# Patient Record
Sex: Female | Born: 1940
Health system: Southern US, Community
[De-identification: ages and names within clinical notes are randomized; demographics above are authoritative.]

## PROBLEM LIST (undated history)

## (undated) DIAGNOSIS — I340 Nonrheumatic mitral (valve) insufficiency: Secondary | ICD-10-CM

## (undated) DIAGNOSIS — I4892 Unspecified atrial flutter: Secondary | ICD-10-CM

## (undated) DIAGNOSIS — I499 Cardiac arrhythmia, unspecified: Secondary | ICD-10-CM

## (undated) DIAGNOSIS — I509 Heart failure, unspecified: Secondary | ICD-10-CM

## (undated) DIAGNOSIS — R0609 Other forms of dyspnea: Secondary | ICD-10-CM

## (undated) DIAGNOSIS — I5030 Unspecified diastolic (congestive) heart failure: Secondary | ICD-10-CM

## (undated) DIAGNOSIS — R0600 Dyspnea, unspecified: Secondary | ICD-10-CM

## (undated) DIAGNOSIS — M1990 Unspecified osteoarthritis, unspecified site: Secondary | ICD-10-CM

## (undated) HISTORY — DX: Other forms of dyspnea: R06.09

## (undated) HISTORY — PX: KNEE ARTHROSCOPY: SUR90

## (undated) HISTORY — DX: Dyspnea, unspecified: R06.00

## (undated) HISTORY — DX: Heart failure, unspecified: I50.9

## (undated) HISTORY — DX: Unspecified atrial flutter: I48.92

## (undated) HISTORY — DX: Cardiac arrhythmia, unspecified: I49.9

## (undated) HISTORY — DX: Nonrheumatic mitral (valve) insufficiency: I34.0

## (undated) HISTORY — DX: Unspecified diastolic (congestive) heart failure: I50.30

---

## 1964-04-22 HISTORY — PX: TUBAL LIGATION: SHX77

## 1964-04-22 HISTORY — PX: CARPAL TUNNEL RELEASE: SHX101

## 2000-08-01 ENCOUNTER — Inpatient Hospital Stay (HOSPITAL_COMMUNITY): Admission: RE | Admit: 2000-08-01 | Discharge: 2000-08-04 | Payer: Self-pay | Admitting: Orthopaedic Surgery

## 2010-04-22 HISTORY — PX: JOINT REPLACEMENT: SHX530

## 2014-07-13 DIAGNOSIS — M25552 Pain in left hip: Secondary | ICD-10-CM | POA: Diagnosis not present

## 2014-07-13 DIAGNOSIS — M542 Cervicalgia: Secondary | ICD-10-CM | POA: Diagnosis not present

## 2014-07-14 DIAGNOSIS — M1610 Unilateral primary osteoarthritis, unspecified hip: Secondary | ICD-10-CM | POA: Diagnosis not present

## 2014-07-14 DIAGNOSIS — M5136 Other intervertebral disc degeneration, lumbar region: Secondary | ICD-10-CM | POA: Diagnosis not present

## 2014-07-14 DIAGNOSIS — M25552 Pain in left hip: Secondary | ICD-10-CM | POA: Diagnosis not present

## 2014-07-14 DIAGNOSIS — Z96641 Presence of right artificial hip joint: Secondary | ICD-10-CM | POA: Diagnosis not present

## 2014-07-14 DIAGNOSIS — M4186 Other forms of scoliosis, lumbar region: Secondary | ICD-10-CM | POA: Diagnosis not present

## 2014-07-14 DIAGNOSIS — M549 Dorsalgia, unspecified: Secondary | ICD-10-CM | POA: Diagnosis not present

## 2014-07-14 DIAGNOSIS — M1612 Unilateral primary osteoarthritis, left hip: Secondary | ICD-10-CM | POA: Diagnosis not present

## 2014-07-25 DIAGNOSIS — M25552 Pain in left hip: Secondary | ICD-10-CM | POA: Diagnosis not present

## 2014-08-03 DIAGNOSIS — I714 Abdominal aortic aneurysm, without rupture: Secondary | ICD-10-CM | POA: Diagnosis not present

## 2014-08-03 DIAGNOSIS — N200 Calculus of kidney: Secondary | ICD-10-CM | POA: Diagnosis not present

## 2014-08-10 DIAGNOSIS — H5200 Hypermetropia, unspecified eye: Secondary | ICD-10-CM | POA: Diagnosis not present

## 2014-08-10 DIAGNOSIS — H5210 Myopia, unspecified eye: Secondary | ICD-10-CM | POA: Diagnosis not present

## 2014-08-16 DIAGNOSIS — M1612 Unilateral primary osteoarthritis, left hip: Secondary | ICD-10-CM | POA: Diagnosis not present

## 2014-09-01 ENCOUNTER — Other Ambulatory Visit (HOSPITAL_COMMUNITY): Payer: Self-pay | Admitting: Orthopaedic Surgery

## 2014-09-09 NOTE — Pre-Procedure Instructions (Addendum)
Marcia Leblanc  09/09/2014      [facility] - [address], [place] [address] Phone: [phone] Fax: [phone]    Your procedure is scheduled on Friday, September 23, 2014  Report to [facility] Admitting at 5:30 A.M.  Call this number if you have problems the morning of surgery:  [phone]   Remember:  Do not eat food or drink liquids after midnight Thursday, September 22, 2014  Take these medicines the morning of surgery with A SIP OF WATER : if needed: acetaminophen (TYLENOL) for pain  Stop taking Aspirin, vitamins and herbal medications. Do not take any NSAIDs ie: Ibuprofen, Advil, Naproxen or any medication containing Aspirin; stop 5 days prior to procedure ( Friday, Sep 18, 2014)   Do not wear jewelry, make-up or nail polish.  Do not wear lotions, powders, or perfumes.  You may not wear deodorant.  Do not shave 48 hours prior to surgery.   Do not bring valuables to the hospital.  [facility] is not responsible for any belongings or valuables.  Contacts, dentures or bridgework may not be worn into surgery.  Leave your suitcase in the car.  After surgery it may be brought to your room.  For patients admitted to the hospital, discharge time will be determined by your treatment team.  Patients discharged the day of surgery will not be allowed to drive home.   Name and phone number of your driver:   Special instructions:   Special Instructions:Special Instructions: [facility] - Preparing for Surgery  Before surgery, you can play an important role.  Because skin is not sterile, your skin needs to be as free of germs as possible.  You can reduce the number of germs on you skin by washing with CHG (chlorahexidine gluconate) soap before surgery.  CHG is an antiseptic cleaner which kills germs and bonds with the skin to continue killing germs even after washing.  Please DO NOT use if you have an allergy to CHG or  antibacterial soaps.  If your skin becomes reddened/irritated stop using the CHG and inform your nurse when you arrive at Short Stay.  Do not shave (including legs and underarms) for at least 48 hours prior to the first CHG shower.  You may shave your face.  Please follow these instructions carefully:   1.  Shower with CHG Soap the night before surgery and the morning of Surgery.  2.  If you choose to wash your hair, wash your hair first as usual with your normal shampoo.  3.  After you shampoo, rinse your hair and body thoroughly to remove the Shampoo.  4.  Use CHG as you would any other liquid soap.  You can apply chg directly  to the skin and wash gently with scrungie or a clean washcloth.  5.  Apply the CHG Soap to your body ONLY FROM THE NECK DOWN.  Do not use on open wounds or open sores.  Avoid contact with your eyes, ears, mouth and genitals (private parts).  Wash genitals (private parts) with your normal soap.  6.  Wash thoroughly, paying special attention to the area where your surgery will be performed.  7.  Thoroughly rinse your body with warm water from the neck down.  8.  DO NOT shower/wash with your normal soap after using and rinsing off the CHG Soap.  9.  Pat yourself dry with a clean towel.  10.  Wear clean pajamas.            11.  Place clean sheets on your bed the night of your first shower and do not sleep with pets.  Day of Surgery  Do not apply any lotions/deodorants the morning of surgery.  Please wear clean clothes to the hospital/surgery center.  Please read over the following fact sheets that you were given. Pain Booklet, Coughing and Deep Breathing, Total Joint Packet, MRSA Information and Surgical Site Infection Prevention

## 2014-09-12 ENCOUNTER — Ambulatory Visit (HOSPITAL_COMMUNITY)
Admission: RE | Admit: 2014-09-12 | Discharge: 2014-09-12 | Disposition: A | Discharge status: Home / Self Care | Payer: Commercial Managed Care - HMO | Source: Ambulatory Visit | Attending: Orthopaedic Surgery | Admitting: Orthopaedic Surgery

## 2014-09-12 ENCOUNTER — Encounter (HOSPITAL_COMMUNITY): Payer: Self-pay

## 2014-09-12 ENCOUNTER — Other Ambulatory Visit (HOSPITAL_COMMUNITY): Payer: Self-pay

## 2014-09-12 ENCOUNTER — Encounter (HOSPITAL_COMMUNITY)
Admission: RE | Admit: 2014-09-12 | Discharge: 2014-09-12 | Disposition: A | Discharge status: Home / Self Care | Payer: Commercial Managed Care - HMO | Source: Ambulatory Visit | Attending: Orthopaedic Surgery | Admitting: Orthopaedic Surgery

## 2014-09-12 DIAGNOSIS — Z01810 Encounter for preprocedural cardiovascular examination: Secondary | ICD-10-CM | POA: Insufficient documentation

## 2014-09-12 DIAGNOSIS — Z01818 Encounter for other preprocedural examination: Secondary | ICD-10-CM | POA: Diagnosis not present

## 2014-09-12 DIAGNOSIS — R509 Fever, unspecified: Secondary | ICD-10-CM | POA: Diagnosis not present

## 2014-09-12 DIAGNOSIS — M169 Osteoarthritis of hip, unspecified: Secondary | ICD-10-CM | POA: Insufficient documentation

## 2014-09-12 DIAGNOSIS — Z01812 Encounter for preprocedural laboratory examination: Secondary | ICD-10-CM | POA: Diagnosis not present

## 2014-09-12 HISTORY — DX: Unspecified osteoarthritis, unspecified site: M19.90

## 2014-09-12 LAB — CBC
HEMATOCRIT: 41.7 % (ref 36.0–46.0)
Hemoglobin: 14.2 g/dL (ref 12.0–15.0)
MCH: 29.5 pg (ref 26.0–34.0)
MCHC: 34.1 g/dL (ref 30.0–36.0)
MCV: 86.7 fL (ref 78.0–100.0)
Platelets: 161 10*3/uL (ref 150–400)
RBC: 4.81 MIL/uL (ref 3.87–5.11)
RDW: 14.3 % (ref 11.5–15.5)
WBC: 7.3 10*3/uL (ref 4.0–10.5)

## 2014-09-12 LAB — COMPREHENSIVE METABOLIC PANEL
ALBUMIN: 3.9 g/dL (ref 3.5–5.0)
ALT: 24 U/L (ref 14–54)
ANION GAP: 10 (ref 5–15)
AST: 23 U/L (ref 15–41)
Alkaline Phosphatase: 54 U/L (ref 38–126)
BUN: 6 mg/dL (ref 6–20)
CALCIUM: 9.6 mg/dL (ref 8.9–10.3)
CHLORIDE: 106 mmol/L (ref 101–111)
CO2: 22 mmol/L (ref 22–32)
CREATININE: 0.76 mg/dL (ref 0.44–1.00)
GFR calc Af Amer: >60 mL/min (ref >60)
GFR calc non Af Amer: >60 mL/min (ref >60)
Glucose, Bld: 99 mg/dL (ref 65–99)
POTASSIUM: 4 mmol/L (ref 3.5–5.1)
Sodium: 138 mmol/L (ref 135–145)
Total Bilirubin: 0.6 mg/dL (ref 0.3–1.2)
Total Protein: 6.6 g/dL (ref 6.5–8.1)

## 2014-09-12 LAB — URINALYSIS, ROUTINE W REFLEX MICROSCOPIC
Bilirubin Urine: NEGATIVE
GLUCOSE, UA: NEGATIVE mg/dL
HGB URINE DIPSTICK: NEGATIVE
Ketones, ur: NEGATIVE mg/dL
Leukocytes, UA: NEGATIVE
Nitrite: NEGATIVE
PROTEIN: NEGATIVE mg/dL
Specific Gravity, Urine: 1.006 (ref 1.005–1.030)
Urobilinogen, UA: 0.2 mg/dL (ref 0.0–1.0)
pH: 7 (ref 5.0–8.0)

## 2014-09-12 LAB — SURGICAL PCR SCREEN
MRSA, PCR: NEGATIVE
Staphylococcus aureus: NEGATIVE

## 2014-09-12 LAB — PROTIME-INR
INR: 1.12 (ref 0.00–1.49)
PROTHROMBIN TIME: 14.6 s (ref 11.6–15.2)

## 2014-09-12 NOTE — Progress Notes (Signed)
reqd ekg, office notes, any cardiac tests from [place] [facility]  Dr Reesa Chew saad

## 2014-09-13 NOTE — Progress Notes (Signed)
Anesthesia Chart Review:  Pt is 74 year old female scheduled for L total hip arthroplasty on 09/23/2014 with Dr. Lorin Mercy.  PMH includes: arthritis. BP at PAT was 153/92, HTN not listed in history (BP was 144/80 at PCP's office on 07/25/14). Former smoker. BMI 25  Preoperative labs reviewed.    Chest x-ray 09/12/2014 reviewed. Mild bibasilar atelectatic change. No frank edema or consolidation.   EKG 09/12/2014: Sinus rhythm with occasional PVCs, new since prior tracing. Possible Left atrial enlargement. RSR' or QR pattern in V1 suggests right ventricular conduction delay. ST depression, consider subendocardial injury.   Discussed EKG with Dr. Glennon Mac who felt it was not significantly unchanged from prior 2002 EKG tracing.   If no changes, I anticipate pt can proceed with surgery as scheduled.   Willeen Cass, FNP-BC [facility]/Anesthesiology Phone: [phone] 09/13/2014 3:47 PM

## 2014-09-22 MED ORDER — CEFAZOLIN SODIUM-DEXTROSE 2-3 GM-% IV SOLR
2.0000 g | INTRAVENOUS | Status: AC
  Administered 2014-09-23: 2 g via INTRAVENOUS
  Filled 2014-09-22: qty 50

## 2014-09-22 NOTE — H&P (Signed)
TOTAL HIP ADMISSION H&P  Patient is admitted for left total hip arthroplasty.  Subjective:  Chief Complaint: left hip pain  HPI: Marcia Leblanc, 74 y.o. female, has a history of pain and functional disability in the left hip(s) due to arthritis and patient has failed non-surgical conservative treatments for greater than 12 weeks to include NSAID's and/or analgesics, use of assistive devices and activity modification.  Onset of symptoms was gradual starting >10 years ago with gradually worsening course since that time. .  Patient currently rates pain in the left hip at 10 out of 10 with activity. Patient has night pain, worsening of pain with activity and weight bearing, trendelenberg gait and joint swelling. Patient has evidence of subchondral cysts, periarticular osteophytes and joint space narrowing by imaging studies. This condition presents safety issues increasing the risk of falls.   There is no current active infection.  There are no active problems to display for this patient.  Past Medical History  Diagnosis Date  . Arthritis     Past Surgical History  Procedure Laterality Date  . Tubal ligation  68  . Joint replacement Right 2012    hip  . Carpal tunnel release Bilateral 68  . Knee arthroscopy Right     torn cartilage    No prescriptions prior to admission   No Known Allergies  History  Substance Use Topics  . Smoking status: Former Smoker -- 0.50 packs/day for 50 years    Types: Cigarettes    Quit date: 09/11/2012  . Smokeless tobacco: Not on file  . Alcohol Use: 4.2 oz/week    7 Glasses of wine per week     Comment: 4 oz daily    No family history on file.   Review of Systems  Unable to perform ROS   Objective:  Physical Exam PHYSICAL EXAMINATION:  The patient is alert and oriented.  Cooperative and comfortable in the sitting position.  Height 5 feet 1 inch.  Weight 137 pounds.  She ambulates with a significant Trendelenburg gait on the left.  She has a cane  with her today.  She has difficulty walking without the cane.  She has good visual acuity with glasses.  No rash over exposed skin.  Right total hip posterior incision is well healed.  Lungs are clear.  Heart:  Regular rate and rhythm.  No wheezing.  Abdomen is soft and nontender.  Distal pulses are 2+.  She has 0 degrees of internal rotation of the left hip, which is painful; 20 degrees of external rotation, which is painful.  No hip flexion contracture.  Knee reaches full extension.  Anterior tib and EHL intact.  She was diagnosed with sciatica several years ago.  No sciatic notch tenderness.  Negative stretch test.  Negative popliteal compression test today.   Vital signs in last 24 hours:    Labs:   There is no height or weight on file to calculate BMI.   Imaging Review Plain radiographs demonstrate moderate degenerative joint disease of the left hip(s). The bone quality appears to be good for age and reported activity level.  Assessment/Plan:  End stage arthritis, left hip(s)  The patient history, physical examination, clinical judgement of the provider and imaging studies are consistent with end stage degenerative joint disease of the left hip(s) and total hip arthroplasty is deemed medically necessary. The treatment options including medical management, injection therapy, arthroscopy and arthroplasty were discussed at length. The risks and benefits of total hip arthroplasty were presented and  reviewed. The risks due to aseptic loosening, infection, stiffness, dislocation/subluxation,  thromboembolic complications and other imponderables were discussed.  The patient acknowledged the explanation, agreed to proceed with the plan and consent was signed. Patient is being admitted for inpatient treatment for surgery, pain control, PT, OT, prophylactic antibiotics, VTE prophylaxis, progressive ambulation and ADL's and discharge planning.The patient is planning to be discharged home with home health  services

## 2014-09-23 ENCOUNTER — Inpatient Hospital Stay (HOSPITAL_COMMUNITY): Payer: Commercial Managed Care - HMO

## 2014-09-23 ENCOUNTER — Inpatient Hospital Stay (HOSPITAL_COMMUNITY)
Admission: RE | Admit: 2014-09-23 | Discharge: 2014-09-25 | DRG: 470 | Disposition: A | Discharge status: Home Health | Payer: Commercial Managed Care - HMO | Source: Ambulatory Visit | Attending: Orthopaedic Surgery | Admitting: Orthopaedic Surgery

## 2014-09-23 ENCOUNTER — Inpatient Hospital Stay (HOSPITAL_COMMUNITY): Payer: Commercial Managed Care - HMO | Admitting: Emergency Medicine

## 2014-09-23 ENCOUNTER — Inpatient Hospital Stay (HOSPITAL_COMMUNITY): Payer: Commercial Managed Care - HMO | Admitting: Anesthesiology

## 2014-09-23 ENCOUNTER — Encounter (HOSPITAL_COMMUNITY)
Admission: RE | Disposition: A | Discharge status: Home Health | Payer: Self-pay | Source: Ambulatory Visit | Attending: Orthopaedic Surgery

## 2014-09-23 ENCOUNTER — Encounter (HOSPITAL_COMMUNITY): Payer: Self-pay | Admitting: *Deleted

## 2014-09-23 DIAGNOSIS — M1612 Unilateral primary osteoarthritis, left hip: Secondary | ICD-10-CM | POA: Diagnosis not present

## 2014-09-23 DIAGNOSIS — Z96642 Presence of left artificial hip joint: Secondary | ICD-10-CM

## 2014-09-23 DIAGNOSIS — Z87891 Personal history of nicotine dependence: Secondary | ICD-10-CM | POA: Diagnosis not present

## 2014-09-23 DIAGNOSIS — Z96641 Presence of right artificial hip joint: Secondary | ICD-10-CM | POA: Diagnosis present

## 2014-09-23 DIAGNOSIS — M25552 Pain in left hip: Secondary | ICD-10-CM | POA: Diagnosis present

## 2014-09-23 DIAGNOSIS — Z419 Encounter for procedure for purposes other than remedying health state, unspecified: Secondary | ICD-10-CM

## 2014-09-23 DIAGNOSIS — M169 Osteoarthritis of hip, unspecified: Secondary | ICD-10-CM | POA: Diagnosis not present

## 2014-09-23 DIAGNOSIS — Z79899 Other long term (current) drug therapy: Secondary | ICD-10-CM

## 2014-09-23 DIAGNOSIS — Z7982 Long term (current) use of aspirin: Secondary | ICD-10-CM

## 2014-09-23 HISTORY — DX: Presence of left artificial hip joint: Z96.642

## 2014-09-23 HISTORY — PX: TOTAL HIP ARTHROPLASTY: SHX124

## 2014-09-23 SURGERY — ARTHROPLASTY, HIP, TOTAL, ANTERIOR APPROACH
Anesthesia: General | Site: Hip | Laterality: Left

## 2014-09-23 MED ORDER — METHOCARBAMOL 1000 MG/10ML IJ SOLN
500.0000 mg | Freq: Four times a day (QID) | INTRAVENOUS | Status: DC | PRN
  Filled 2014-09-23: qty 5

## 2014-09-23 MED ORDER — HYDROMORPHONE HCL 1 MG/ML IJ SOLN
0.5000 mg | INTRAMUSCULAR | Status: DC | PRN
  Administered 2014-09-23 – 2014-09-24 (×3): 0.5 mg via INTRAVENOUS
  Filled 2014-09-23 (×3): qty 1

## 2014-09-23 MED ORDER — 0.9 % SODIUM CHLORIDE (POUR BTL) OPTIME
TOPICAL | Status: DC | PRN
  Administered 2014-09-23: 1000 mL

## 2014-09-23 MED ORDER — PROPOFOL 10 MG/ML IV BOLUS
INTRAVENOUS | Status: DC | PRN
  Administered 2014-09-23: 140 mg via INTRAVENOUS

## 2014-09-23 MED ORDER — DOCUSATE SODIUM 100 MG PO CAPS
100.0000 mg | ORAL_CAPSULE | Freq: Two times a day (BID) | ORAL | Status: DC
  Administered 2014-09-24 – 2014-09-25 (×3): 100 mg via ORAL
  Filled 2014-09-23 (×4): qty 1

## 2014-09-23 MED ORDER — METOCLOPRAMIDE HCL 5 MG PO TABS: 5.0000 mg | ORAL_TABLET | Freq: Three times a day (TID) | ORAL | Status: DC | PRN

## 2014-09-23 MED ORDER — CHLORHEXIDINE GLUCONATE 4 % EX LIQD: 60.0000 mL | Freq: Once | CUTANEOUS | Status: DC

## 2014-09-23 MED ORDER — CEFAZOLIN SODIUM-DEXTROSE 2-3 GM-% IV SOLR
2.0000 g | Freq: Once | INTRAVENOUS | Status: AC
  Administered 2014-09-23: 2 g via INTRAVENOUS
  Filled 2014-09-23: qty 50

## 2014-09-23 MED ORDER — MENTHOL 3 MG MT LOZG: 1.0000 | LOZENGE | OROMUCOSAL | Status: DC | PRN

## 2014-09-23 MED ORDER — DEXTROSE 5 % IV SOLN
500.0000 mg | INTRAVENOUS | Status: AC
  Administered 2014-09-23: 500 mg via INTRAVENOUS
  Filled 2014-09-23 (×2): qty 5

## 2014-09-23 MED ORDER — NEOSTIGMINE METHYLSULFATE 10 MG/10ML IV SOLN
INTRAVENOUS | Status: DC | PRN
  Administered 2014-09-23: 2.5 mg via INTRAVENOUS

## 2014-09-23 MED ORDER — PROMETHAZINE HCL 25 MG/ML IJ SOLN: 6.2500 mg | INTRAMUSCULAR | Status: DC | PRN

## 2014-09-23 MED ORDER — FENTANYL CITRATE (PF) 100 MCG/2ML IJ SOLN
INTRAMUSCULAR | Status: DC | PRN
  Administered 2014-09-23: 100 ug via INTRAVENOUS
  Administered 2014-09-23 (×6): 50 ug via INTRAVENOUS
  Administered 2014-09-23: 100 ug via INTRAVENOUS

## 2014-09-23 MED ORDER — DEXAMETHASONE SODIUM PHOSPHATE 10 MG/ML IJ SOLN
INTRAMUSCULAR | Status: DC | PRN
  Administered 2014-09-23: 10 mg via INTRAVENOUS

## 2014-09-23 MED ORDER — METHOCARBAMOL 500 MG PO TABS
500.0000 mg | ORAL_TABLET | Freq: Four times a day (QID) | ORAL | Status: DC | PRN
  Administered 2014-09-23 – 2014-09-25 (×4): 500 mg via ORAL
  Filled 2014-09-23 (×5): qty 1

## 2014-09-23 MED ORDER — ROCURONIUM BROMIDE 50 MG/5ML IV SOLN
INTRAVENOUS | Status: AC
  Filled 2014-09-23: qty 1

## 2014-09-23 MED ORDER — LIDOCAINE HCL (CARDIAC) 20 MG/ML IV SOLN
INTRAVENOUS | Status: DC | PRN
  Administered 2014-09-23: 100 mg via INTRAVENOUS

## 2014-09-23 MED ORDER — GLYCOPYRROLATE 0.2 MG/ML IJ SOLN
INTRAMUSCULAR | Status: DC | PRN
Start: 2014-09-23 — End: 2014-09-23
  Administered 2014-09-23: .5 mg via INTRAVENOUS

## 2014-09-23 MED ORDER — ONDANSETRON HCL 4 MG/2ML IJ SOLN
4.0000 mg | Freq: Four times a day (QID) | INTRAMUSCULAR | Status: DC | PRN
  Administered 2014-09-23: 4 mg via INTRAVENOUS
  Filled 2014-09-23: qty 2

## 2014-09-23 MED ORDER — ONDANSETRON HCL 4 MG/2ML IJ SOLN
INTRAMUSCULAR | Status: DC | PRN
  Administered 2014-09-23: 4 mg via INTRAVENOUS

## 2014-09-23 MED ORDER — ACETAMINOPHEN 650 MG RE SUPP: 650.0000 mg | Freq: Four times a day (QID) | RECTAL | Status: DC | PRN

## 2014-09-23 MED ORDER — HYDROMORPHONE HCL 1 MG/ML IJ SOLN
INTRAMUSCULAR | Status: AC
  Administered 2014-09-23: 0.5 mg via INTRAVENOUS
  Filled 2014-09-23: qty 1

## 2014-09-23 MED ORDER — LIDOCAINE HCL (CARDIAC) 20 MG/ML IV SOLN
INTRAVENOUS | Status: AC
  Filled 2014-09-23: qty 5

## 2014-09-23 MED ORDER — DEXAMETHASONE SODIUM PHOSPHATE 10 MG/ML IJ SOLN
INTRAMUSCULAR | Status: AC
  Filled 2014-09-23: qty 1

## 2014-09-23 MED ORDER — EPHEDRINE SULFATE 50 MG/ML IJ SOLN
INTRAMUSCULAR | Status: DC | PRN
  Administered 2014-09-23: 10 mg via INTRAVENOUS

## 2014-09-23 MED ORDER — HYDROMORPHONE HCL 1 MG/ML IJ SOLN
0.2500 mg | INTRAMUSCULAR | Status: DC | PRN
  Administered 2014-09-23 (×2): 0.5 mg via INTRAVENOUS

## 2014-09-23 MED ORDER — POTASSIUM CHLORIDE IN NACL 20-0.45 MEQ/L-% IV SOLN
INTRAVENOUS | Status: DC
  Administered 2014-09-23: 14:00:00 via INTRAVENOUS
  Filled 2014-09-23 (×6): qty 1000

## 2014-09-23 MED ORDER — OXYCODONE HCL 5 MG PO TABS
5.0000 mg | ORAL_TABLET | ORAL | Status: DC | PRN
  Administered 2014-09-23 – 2014-09-25 (×9): 10 mg via ORAL
  Filled 2014-09-23 (×4): qty 2
  Filled 2014-09-23: qty 1
  Filled 2014-09-23 (×6): qty 2

## 2014-09-23 MED ORDER — FENTANYL CITRATE (PF) 250 MCG/5ML IJ SOLN
INTRAMUSCULAR | Status: AC
  Filled 2014-09-23: qty 5

## 2014-09-23 MED ORDER — ASPIRIN EC 325 MG PO TBEC
325.0000 mg | DELAYED_RELEASE_TABLET | Freq: Every day | ORAL | Status: DC
  Administered 2014-09-24 – 2014-09-25 (×2): 325 mg via ORAL
  Filled 2014-09-23 (×2): qty 1

## 2014-09-23 MED ORDER — PHENYLEPHRINE HCL 10 MG/ML IJ SOLN
INTRAMUSCULAR | Status: DC | PRN
  Administered 2014-09-23 (×5): 40 ug via INTRAVENOUS

## 2014-09-23 MED ORDER — METOCLOPRAMIDE HCL 5 MG/ML IJ SOLN
5.0000 mg | Freq: Three times a day (TID) | INTRAMUSCULAR | Status: DC | PRN
  Administered 2014-09-23: 10 mg via INTRAVENOUS
  Filled 2014-09-23: qty 2

## 2014-09-23 MED ORDER — BISACODYL 10 MG RE SUPP: 10.0000 mg | Freq: Every day | RECTAL | Status: DC | PRN

## 2014-09-23 MED ORDER — PROPOFOL 10 MG/ML IV BOLUS
INTRAVENOUS | Status: AC
  Filled 2014-09-23: qty 20

## 2014-09-23 MED ORDER — MIDAZOLAM HCL 2 MG/2ML IJ SOLN
INTRAMUSCULAR | Status: AC
  Filled 2014-09-23: qty 2

## 2014-09-23 MED ORDER — POLYETHYLENE GLYCOL 3350 17 G PO PACK: 17.0000 g | PACK | Freq: Every day | ORAL | Status: DC | PRN

## 2014-09-23 MED ORDER — NEOSTIGMINE METHYLSULFATE 10 MG/10ML IV SOLN
INTRAVENOUS | Status: AC
  Filled 2014-09-23: qty 1

## 2014-09-23 MED ORDER — ACETAMINOPHEN 325 MG PO TABS: 650.0000 mg | ORAL_TABLET | Freq: Four times a day (QID) | ORAL | Status: DC | PRN

## 2014-09-23 MED ORDER — OCUVITE-LUTEIN PO CAPS
1.0000 | ORAL_CAPSULE | Freq: Every day | ORAL | Status: DC
  Administered 2014-09-24 – 2014-09-25 (×2): 1 via ORAL
  Filled 2014-09-23 (×5): qty 1

## 2014-09-23 MED ORDER — PHENYLEPHRINE 40 MCG/ML (10ML) SYRINGE FOR IV PUSH (FOR BLOOD PRESSURE SUPPORT)
PREFILLED_SYRINGE | INTRAVENOUS | Status: AC
  Filled 2014-09-23: qty 10

## 2014-09-23 MED ORDER — PHENOL 1.4 % MT LIQD
1.0000 | OROMUCOSAL | Status: DC | PRN
Start: 2014-09-23 — End: 2014-09-25

## 2014-09-23 MED ORDER — MEPERIDINE HCL 25 MG/ML IJ SOLN: 6.2500 mg | INTRAMUSCULAR | Status: DC | PRN

## 2014-09-23 MED ORDER — GLYCOPYRROLATE 0.2 MG/ML IJ SOLN
INTRAMUSCULAR | Status: AC
  Filled 2014-09-23: qty 4

## 2014-09-23 MED ORDER — ONDANSETRON HCL 4 MG/2ML IJ SOLN
INTRAMUSCULAR | Status: AC
  Filled 2014-09-23: qty 2

## 2014-09-23 MED ORDER — LACTATED RINGERS IV SOLN
INTRAVENOUS | Status: DC | PRN
  Administered 2014-09-23 (×3): via INTRAVENOUS

## 2014-09-23 MED ORDER — ROCURONIUM BROMIDE 100 MG/10ML IV SOLN
INTRAVENOUS | Status: DC | PRN
  Administered 2014-09-23: 10 mg via INTRAVENOUS
  Administered 2014-09-23: 40 mg via INTRAVENOUS

## 2014-09-23 MED ORDER — ONDANSETRON HCL 4 MG PO TABS: 4.0000 mg | ORAL_TABLET | Freq: Four times a day (QID) | ORAL | Status: DC | PRN

## 2014-09-23 MED ORDER — MIDAZOLAM HCL 5 MG/5ML IJ SOLN
INTRAMUSCULAR | Status: DC | PRN
  Administered 2014-09-23: 1 mg via INTRAVENOUS

## 2014-09-23 SURGICAL SUPPLY — 48 items
BLADE SAW SGTL 18X1.27X75 (BLADE) ×2 IMPLANT
BLADE SAW SGTL 18X1.27X75MM (BLADE) ×1
BLADE SURG ROTATE 9660 (MISCELLANEOUS) IMPLANT
CAPT HIP TOTAL 2 ×3 IMPLANT
CELLS DAT CNTRL 66122 CELL SVR (MISCELLANEOUS) ×1 IMPLANT
COVER SURGICAL LIGHT HANDLE (MISCELLANEOUS) ×3 IMPLANT
DERMABOND ADVANCED (GAUZE/BANDAGES/DRESSINGS) ×2
DERMABOND ADVANCED .7 DNX12 (GAUZE/BANDAGES/DRESSINGS) ×1 IMPLANT
DRAPE C-ARM 42X72 X-RAY (DRAPES) ×3 IMPLANT
DRAPE IMP U-DRAPE 54X76 (DRAPES) ×3 IMPLANT
DRAPE STERI IOBAN 125X83 (DRAPES) ×3 IMPLANT
DRAPE U-SHAPE 47X51 STRL (DRAPES) ×9 IMPLANT
DRSG MEPILEX BORDER 4X8 (GAUZE/BANDAGES/DRESSINGS) ×3 IMPLANT
DURAPREP 26ML APPLICATOR (WOUND CARE) ×3 IMPLANT
ELECT BLADE 4.0 EZ CLEAN MEGAD (MISCELLANEOUS)
ELECT BLADE TIP CTD 4 INCH (ELECTRODE) ×3 IMPLANT
ELECT CAUTERY BLADE 6.4 (BLADE) ×3 IMPLANT
ELECT REM PT RETURN 9FT ADLT (ELECTROSURGICAL) ×3
ELECTRODE BLDE 4.0 EZ CLN MEGD (MISCELLANEOUS) IMPLANT
ELECTRODE REM PT RTRN 9FT ADLT (ELECTROSURGICAL) ×1 IMPLANT
FACESHIELD WRAPAROUND (MASK) ×6 IMPLANT
GLOVE BIOGEL PI IND STRL 8 (GLOVE) ×2 IMPLANT
GLOVE BIOGEL PI INDICATOR 8 (GLOVE) ×4
GLOVE ORTHO TXT STRL SZ7.5 (GLOVE) ×6 IMPLANT
GOWN STRL REUS W/ TWL LRG LVL3 (GOWN DISPOSABLE) ×1 IMPLANT
GOWN STRL REUS W/ TWL XL LVL3 (GOWN DISPOSABLE) ×1 IMPLANT
GOWN STRL REUS W/TWL 2XL LVL3 (GOWN DISPOSABLE) ×3 IMPLANT
GOWN STRL REUS W/TWL LRG LVL3 (GOWN DISPOSABLE) ×2
GOWN STRL REUS W/TWL XL LVL3 (GOWN DISPOSABLE) ×2
KIT BASIN OR (CUSTOM PROCEDURE TRAY) ×3 IMPLANT
KIT ROOM TURNOVER OR (KITS) ×3 IMPLANT
MANIFOLD NEPTUNE II (INSTRUMENTS) ×3 IMPLANT
NS IRRIG 1000ML POUR BTL (IV SOLUTION) ×3 IMPLANT
PACK TOTAL JOINT (CUSTOM PROCEDURE TRAY) ×3 IMPLANT
PACK UNIVERSAL I (CUSTOM PROCEDURE TRAY) ×3 IMPLANT
PAD ARMBOARD 7.5X6 YLW CONV (MISCELLANEOUS) ×6 IMPLANT
RTRCTR WOUND ALEXIS 18CM MED (MISCELLANEOUS) ×3
SUT ETHIBOND NAB CT1 #1 30IN (SUTURE) IMPLANT
SUT VIC AB 0 CT1 27 (SUTURE) ×2
SUT VIC AB 0 CT1 27XBRD ANBCTR (SUTURE) ×1 IMPLANT
SUT VIC AB 2-0 CT1 27 (SUTURE) ×2
SUT VIC AB 2-0 CT1 TAPERPNT 27 (SUTURE) ×1 IMPLANT
SUT VICRYL 4-0 PS2 18IN ABS (SUTURE) ×3 IMPLANT
SUT VLOC 180 0 24IN GS25 (SUTURE) ×3 IMPLANT
TOWEL OR 17X24 6PK STRL BLUE (TOWEL DISPOSABLE) ×3 IMPLANT
TOWEL OR 17X26 10 PK STRL BLUE (TOWEL DISPOSABLE) ×6 IMPLANT
TRAY FOLEY CATH 16FRSI W/METER (SET/KITS/TRAYS/PACK) IMPLANT
WATER STERILE IRR 1000ML POUR (IV SOLUTION) ×6 IMPLANT

## 2014-09-23 NOTE — Brief Op Note (Signed)
09/23/2014  10:05 AM  PATIENT:  Marcia Leblanc  74 y.o. female  PRE-OPERATIVE DIAGNOSIS:  Osteoarthritis Left Hip  POST-OPERATIVE DIAGNOSIS:  Osteoarthritis Left Hip  PROCEDURE:  Procedure(s): TOTAL HIP ARTHROPLASTY ANTERIOR APPROACH (Left)  SURGEON:  Surgeon(s) and Role:    Marybelle Killings, MD - Primary  PHYSICIAN ASSISTANT: Rosaleigh Brazzel m. Arryanna Holquin pa-c  }   ANESTHESIA:   general  EBL:  Total I/O In: 2100 [I.V.:2100] Out: 650 [Urine:150; Blood:500]  BLOOD ADMINISTERED:none  DRAINS: none   LOCAL MEDICATIONS USED:  NONE  SPECIMEN:  No Specimen  DISPOSITION OF SPECIMEN:  N/A  COUNTS:  YES  TOURNIQUET:  * No tourniquets in log * PATIENT DISPOSITION:  PACU - hemodynamically stable.

## 2014-09-23 NOTE — Anesthesia Procedure Notes (Signed)
Procedure Name: Intubation Date/Time: 09/23/2014 7:34 AM Performed by: Ignacia Bayley Pre-anesthesia Checklist: Patient identified, Emergency Drugs available, Suction available and Patient being monitored Patient Re-evaluated:Patient Re-evaluated prior to inductionOxygen Delivery Method: Circle system utilized Preoxygenation: Pre-oxygenation with 100% oxygen Intubation Type: IV induction Ventilation: Mask ventilation without difficulty Laryngoscope Size: Miller and 2 Grade View: Grade II Tube type: Oral Tube size: 7.0 mm Number of attempts: 1 Airway Equipment and Method: Stylet Placement Confirmation: ETT inserted through vocal cords under direct vision,  positive ETCO2 and breath sounds checked- equal and bilateral Secured at: 20 cm Tube secured with: Tape Dental Injury: Teeth and Oropharynx as per pre-operative assessment

## 2014-09-23 NOTE — Plan of Care (Signed)
Problem: Consults Goal: Diagnosis- Total Joint Replacement Primary Total Hip Left     

## 2014-09-23 NOTE — Progress Notes (Signed)
Report given to maria rn as caregiver 

## 2014-09-23 NOTE — Evaluation (Signed)
Physical Therapy Evaluation Patient Details Name: Marcia Leblanc MRN: [id number] DOB: 12-27-1940 Today's Date: 09/23/2014   History of Present Illness  Patient is a 74 y/o female s/p L THA, anterior approach. PMH includes arthritis.  Clinical Impression  Patient presents with pain, nausea and post surgical deficits LLE s/p above surgery impacting mobility. Increased time to perform all movements. Pt will have 24/7 S at home. Reviewed exercises. Encouraged OOB for all Leblanc. Would benefit from skilled PT to maximize independence and mobility prior to return home.    Follow Up Recommendations Home health PT;Supervision/Assistance - 24 hour    Equipment Recommendations  Rolling walker with 5" wheels    Recommendations for Other Services       Precautions / Restrictions Precautions Precautions: Fall Precaution Comments: anterior approach - does not specify direct anterior. Restrictions Weight Bearing Restrictions: Yes LLE Weight Bearing: Weight bearing as tolerated      Mobility  Bed Mobility Overal bed mobility: Needs Assistance Bed Mobility: Supine to Sit     Supine to sit: Min guard;HOB elevated     General bed mobility comments: Min guard for safety. Cues for technique. Increased time. + nausea sitting EOB.  Transfers Overall transfer level: Needs assistance Equipment used: Rolling walker (2 wheeled) Transfers: Sit to/from Stand Sit to Stand: Min assist         General transfer comment: Min A to rise from EOB x1 with cues for hand placement and technique. Pt placing most weight through RLE. Cues for upright. Transferred to chair.  Ambulation/Gait Ambulation/Gait assistance: Min assist Ambulation Distance (Feet): 6 Feet Assistive device: Rolling walker (2 wheeled) Gait Pattern/deviations: Step-to pattern;Decreased stance time - left;Decreased step length - right;Trunk flexed   Gait velocity interpretation: Below normal speed for age/gender General Gait Details:  Pt with very slow, guarded gait. Assist with weightshifting to advance LEs.  Stairs            Wheelchair Mobility    Modified Rankin (Stroke Patients Only)       Balance Overall balance assessment: Needs assistance Sitting-balance support: Feet supported;Single extremity supported Sitting balance-Leahy Scale: Fair     Standing balance support: During functional activity Standing balance-Leahy Scale: Poor Standing balance comment: Relient on RW for support.                              Pertinent Vitals/Pain Pain Assessment: Faces Faces Pain Scale: Hurts whole lot Pain Location: left hip Pain Descriptors / Indicators: Sore;Aching;Sharp Pain Intervention(s): Ice applied;Limited activity within patient's tolerance;Monitored during session;Repositioned    Home Living Family/patient expects to be discharged to:: Private residence Living Arrangements: Spouse/significant other Available Help at Discharge: Family;Available 24 hours/day Type of Home: House Home Access: Stairs to enter Entrance Stairs-Rails: None Entrance Stairs-Number of Steps: 2 Home Layout: One level Home Equipment: Walker - standard;Cane - single point;Bedside commode      Prior Function Level of Independence: Independent with assistive device(s)         Comments: Pt using SPC PTA. Works as Armed forces operational officer.     Hand Dominance        Extremity/Trunk Assessment   Upper Extremity Assessment: Defer to OT evaluation           Lower Extremity Assessment: LLE deficits/detail;Generalized weakness   LLE Deficits / Details: Limited AROM hip flexion secondary to pain/post surgery.     Communication   Communication: No difficulties  Cognition Arousal/Alertness: Awake/alert Behavior During Therapy:  WFL for tasks assessed/performed Overall Cognitive Status: Within Functional Limits for tasks assessed                      General Comments General comments (skin  integrity, edema, etc.): Spouse present during PT evaluation.    Exercises Total Joint Exercises Ankle Circles/Pumps: Both;10 reps;Supine Quad Sets: Both;10 reps;Supine      Assessment/Plan    PT Assessment Patient needs continued PT services  PT Diagnosis Acute pain;Generalized weakness;Difficulty walking   PT Problem List Decreased strength;Pain;Decreased range of motion;Impaired sensation;Decreased activity tolerance;Decreased balance;Decreased mobility  PT Treatment Interventions Balance training;Gait training;Stair training;Functional mobility training;Therapeutic activities;Therapeutic exercise;Patient/family education   PT Goals (Current goals can be found in the Care Plan section) Acute Rehab PT Goals Patient Stated Goal: none stated PT Goal Formulation: With patient Time For Goal Achievement: 10/07/14 Potential to Achieve Goals: Good    Frequency 7X/week   Barriers to discharge        Co-evaluation               End of Session Equipment Utilized During Treatment: Gait belt Activity Tolerance: Patient tolerated treatment well;Patient limited by pain Patient left: in chair;with call bell/phone within reach;with family/visitor present Nurse Communication: Mobility status         Time: 3149-7026 PT Time Calculation (min) (ACUTE ONLY): 27 min   Charges:   PT Evaluation $Initial PT Evaluation Tier I: 1 Procedure PT Treatments $Therapeutic Activity: 8-22 mins   PT G Codes:        Marcia Leblanc 09/23/2014, 2:35 PM  Wray Kearns, [place], DPT [phone]

## 2014-09-23 NOTE — Anesthesia Preprocedure Evaluation (Signed)
Anesthesia Evaluation  Patient identified by MRN, date of birth, ID band Patient awake    Reviewed: Allergy & Precautions, NPO status , Patient's Chart, lab work & pertinent test results  Airway Mallampati: II  TM Distance: >3 FB Neck ROM: Full    Dental no notable dental hx.    Pulmonary neg pulmonary ROS, former smoker,  breath sounds clear to auscultation  Pulmonary exam normal       Cardiovascular negative cardio ROS Normal cardiovascular examRhythm:Regular Rate:Normal     Neuro/Psych negative neurological ROS  negative psych ROS   GI/Hepatic negative GI ROS, Neg liver ROS,   Endo/Other  negative endocrine ROS  Renal/GU negative Renal ROS     Musculoskeletal  (+) Arthritis -,   Abdominal   Peds  Hematology negative hematology ROS (+)   Anesthesia Other Findings   Reproductive/Obstetrics negative OB ROS                             Anesthesia Physical Anesthesia Plan  ASA: II  Anesthesia Plan:    Post-op Pain Management:    Induction:   Airway Management Planned:   Additional Equipment:   Intra-op Plan:   Post-operative Plan:   Informed Consent: I have reviewed the patients History and Physical, chart, labs and discussed the procedure including the risks, benefits and alternatives for the proposed anesthesia with the patient or authorized representative who has indicated his/her understanding and acceptance.   Dental advisory given  Plan Discussed with: CRNA  Anesthesia Plan Comments:         Anesthesia Quick Evaluation

## 2014-09-23 NOTE — Transfer of Care (Signed)
Immediate Anesthesia Transfer of Care Note  Patient: Marcia Leblanc  Procedure(s) Performed: Procedure(s): TOTAL HIP ARTHROPLASTY ANTERIOR APPROACH (Left)  Patient Location: PACU  Anesthesia Type:General  Level of Consciousness: awake  Airway & Oxygen Therapy: Patient Spontanous Breathing and Patient connected to nasal cannula oxygen  Post-op Assessment: Report given to RN and Post -op Vital signs reviewed and stable  Post vital signs: stable  Last Vitals:  Filed Vitals:   09/23/14 0627  BP: 157/84  Pulse: 82  Temp: 36.8 C  Resp: 20    Complications: No apparent anesthesia complications

## 2014-09-23 NOTE — Anesthesia Postprocedure Evaluation (Signed)
Anesthesia Post Note  Patient: Marcia Leblanc  Procedure(s) Performed: Procedure(s) (LRB): TOTAL HIP ARTHROPLASTY ANTERIOR APPROACH (Left)  Anesthesia type: General  Patient location: PACU  Post pain: Pain level controlled  Post assessment: Post-op Vital signs reviewed  Last Vitals: BP 126/76 mmHg  Pulse 105  Temp(Src) 36.4 C (Oral)  Resp 16  Wt 137 lb 14.4 oz (62.551 kg)  SpO2 98%  Post vital signs: Reviewed  Level of consciousness: sedated  Complications: No apparent anesthesia complications

## 2014-09-23 NOTE — Interval H&P Note (Signed)
History and Physical Interval Note:  09/23/2014 7:16 AM  Marcia Leblanc  has presented today for surgery, with the diagnosis of Osteoarthritis Left Hip  The various methods of treatment have been discussed with the patient and family. After consideration of risks, benefits and other options for treatment, the patient has consented to  Procedure(s): TOTAL HIP ARTHROPLASTY ANTERIOR APPROACH (Left) as a surgical intervention .  The patient's history has been reviewed, patient examined, no change in status, stable for surgery.  I have reviewed the patient's chart and labs.  Questions were answered to the patient's satisfaction.     Essica Kiker C

## 2014-09-24 LAB — CBC
HCT: 28.7 % — ABNORMAL LOW (ref 36.0–46.0)
Hemoglobin: 9.7 g/dL — ABNORMAL LOW (ref 12.0–15.0)
MCH: 29.2 pg (ref 26.0–34.0)
MCHC: 33.8 g/dL (ref 30.0–36.0)
MCV: 86.4 fL (ref 78.0–100.0)
Platelets: 143 10*3/uL — ABNORMAL LOW (ref 150–400)
RBC: 3.32 MIL/uL — ABNORMAL LOW (ref 3.87–5.11)
RDW: 14.8 % (ref 11.5–15.5)
WBC: 7.5 10*3/uL (ref 4.0–10.5)

## 2014-09-24 LAB — BASIC METABOLIC PANEL
Anion gap: 10 (ref 5–15)
BUN: 6 mg/dL (ref 6–20)
CALCIUM: 8.5 mg/dL — AB (ref 8.9–10.3)
CO2: 23 mmol/L (ref 22–32)
Chloride: 103 mmol/L (ref 101–111)
Creatinine, Ser: 0.78 mg/dL (ref 0.44–1.00)
GFR calc Af Amer: >60 mL/min (ref >60)
GFR calc non Af Amer: >60 mL/min (ref >60)
GLUCOSE: 109 mg/dL — AB (ref 65–99)
Potassium: 3.7 mmol/L (ref 3.5–5.1)
Sodium: 136 mmol/L (ref 135–145)

## 2014-09-24 NOTE — Evaluation (Signed)
Occupational Therapy Evaluation Patient Details Name: Marcia Leblanc MRN: [id number] DOB: December 27, 1940 Today's Date: 09/24/2014    History of Present Illness Patient is a 74 y/o female s/p L THA, anterior approach. PMH includes arthritis.   Clinical Impression   This 74 yo female admitted with above presents to acute OT with increased pain, decreased mobility, decreased balance, decreased knowledge of anterior hip precautions all affecting her ability to care for herself at an Independent level as she did pta. She will benefit from one more session of OT to address tub transfers and any other questions she and husband may have about BADLs.    Follow Up Recommendations  No OT follow up    Equipment Recommendations  None recommended by OT       Precautions / Restrictions Precautions Precautions: Fall;Anterior Hip Precaution Booklet Issued: Yes (comment) Restrictions Weight Bearing Restrictions: No LLE Weight Bearing: Weight bearing as tolerated      Mobility Bed Mobility Overal bed mobility: Needs Assistance Bed Mobility: Supine to Sit     Supine to sit: Min guard (HOB flat, used rails)        Transfers Overall transfer level: Needs assistance Equipment used: Rolling walker (2 wheeled) Transfers: Sit to/from Stand Sit to Stand: Min guard         General transfer comment: Cues for safe hand placement         ADL Overall ADL's : Needs assistance/impaired Eating/Feeding: Independent;Sitting   Grooming: Wash/dry hands;Min guard;Standing   Upper Body Bathing: Set up;Sitting   Lower Body Bathing: Moderate assistance (with min guard A sit<>stand)   Upper Body Dressing : Set up;Sitting   Lower Body Dressing: Maximal assistance (with min guard A sit<>stand)   Toilet Transfer: Min guard;Ambulation;RW;BSC (over toilet)   Toileting- Clothing Manipulation and Hygiene: Min guard;Sit to/from stand               Vision Additional Comments: No change from  baseline          Pertinent Vitals/Pain Pain Assessment: 0-10 Pain Score: 7  Pain Location: left hip Pain Descriptors / Indicators: Aching;Sore;Spasm Pain Intervention(s): Monitored during session;Repositioned;Premedicated before session     Hand Dominance Right   Extremity/Trunk Assessment Upper Extremity Assessment Upper Extremity Assessment: Overall WFL for tasks assessed           Communication Communication Communication: No difficulties   Cognition Arousal/Alertness: Awake/alert Behavior During Therapy: WFL for tasks assessed/performed Overall Cognitive Status: Within Functional Limits for tasks assessed                                Home Living Family/patient expects to be discharged to:: Private residence Living Arrangements: Spouse/significant other Available Help at Discharge: Family;Available 24 hours/day Type of Home: House Home Access: Stairs to enter   Entrance Stairs-Rails: None Home Layout: One level     Bathroom Shower/Tub: Tub/shower unit;Curtain Shower/tub characteristics: Architectural technologist: Standard     Home Equipment: Walker - standard;Cane - single point;Bedside commode;Shower seat          Prior Functioning/Environment Level of Independence: Independent with assistive device(s)        Comments: Pt using SPC PTA. Works as Armed forces operational officer.    OT Diagnosis: Generalized weakness;Acute pain   OT Problem List: Decreased strength;Decreased range of motion;Impaired balance (sitting and/or standing);Pain;Decreased knowledge of use of DME or AE;Decreased knowledge of precautions   OT Treatment/Interventions: Self-care/ADL training;Patient/family education;Balance training;DME and/or AE  instruction;Therapeutic activities    OT Goals(Current goals can be found in the care plan section) Acute Rehab OT Goals Patient Stated Goal: home maybe tomorrow OT Goal Formulation: With patient/family Time For Goal Achievement:  10/01/14 Potential to Achieve Goals: Good  OT Frequency: Min 2X/week           Co-evaluation PT/OT/SLP Co-Evaluation/Treatment: Yes Reason for Co-Treatment:  (pt anxiety)   OT goals addressed during session: ADL's and self-care;Strengthening/ROM      End of Session Equipment Utilized During Treatment: Gait belt;Rolling walker  Activity Tolerance: Patient tolerated treatment well Patient left: in chair;with call bell/phone within reach;with family/visitor present   Time: 1222-4114 OT Time Calculation (min): 29 min Charges:  OT General Charges $OT Visit: 1 Procedure OT Treatments $Self Care/Home Management : 8-22 mins  Almon Register [phone] 09/24/2014, 11:35 AM

## 2014-09-24 NOTE — Care Management Note (Signed)
Case Management Note  Patient Details  Name: Marcia Leblanc MRN: [id number] Date of Birth: [birth year]  Subjective/Objective:                   PROCEDURE: Left direct anterior total hip arthroplasty. Action/Plan:  Discharge planning Expected Discharge Date:  09/24/14               Expected Discharge Plan:  [place]  In-House Referral:     Discharge planning Services  CM Consult  Post Acute Care Choice:  Home Health Choice offered to:     DME Arranged:    DME Agency:     HH Arranged:  PT [place]:  [place]  Status of Service:  Completed, signed off  Medicare Important Message Given:    Date Medicare IM Given:    Medicare IM give by:    Date Additional Medicare IM Given:    Additional Medicare Important Message give by:     If discussed at [place] of Stay Meetings, dates discussed:    Additional Comments: CM received call stating pt's insurance not accepted by [place].  CM called pt and explained and set up pt with [facility] for HHPT bc they WILL accept her insurance.  Referral made to [facility] rep, Tiffany.  NO other CM needs were communicated. Dellie Catholic, RN 09/24/2014, 12:07 PM

## 2014-09-24 NOTE — Progress Notes (Signed)
Subjective: 1 Day Post-Op Procedure(s) (LRB): TOTAL HIP ARTHROPLASTY ANTERIOR APPROACH (Left) Patient reports pain as 4 on 0-10 scale.   " I am ready to walk "  Objective: Vital signs in last 24 hours: Temp:  [97.6 F (36.4 C)-99.1 F (37.3 C)] 99.1 F (37.3 C) (06/04 0555) Pulse Rate:  [68-105] 99 (06/04 0555) Resp:  [10-20] 17 (06/04 0555) BP: (99-157)/(49-84) 120/67 mmHg (06/04 0555) SpO2:  [93 %-100 %] 96 % (06/04 0555) Weight:  [62.551 kg (137 lb 14.4 oz)] 62.551 kg (137 lb 14.4 oz) (06/03 0627)  Intake/Output from previous day: 06/03 0701 - 06/04 0700 In: 2460 [P.O.:360; I.V.:2100] Out: 1550 [Urine:1050; Blood:500] Intake/Output this shift: Total I/O In: -  Out: 700 [Urine:700]  No results for input(s): HGB in the last 72 hours. No results for input(s): WBC, RBC, HCT, PLT in the last 72 hours. No results for input(s): NA, K, CL, CO2, BUN, CREATININE, GLUCOSE, CALCIUM in the last 72 hours. No results for input(s): LABPT, INR in the last 72 hours.  Neurologically intact  Assessment/Plan: 1 Day Post-Op Procedure(s) (LRB): TOTAL HIP ARTHROPLASTY ANTERIOR APPROACH (Left) Up with therapy  Laurisa Sahakian C 09/24/2014, 5:56 AM

## 2014-09-24 NOTE — Op Note (Signed)
Marcia Leblanc, Marcia Leblanc               ACCOUNT NO.:  [id number]  MEDICAL RECORD NO.:  [id number]  LOCATION:  5N09C                        FACILITY:  [facility]  PHYSICIAN:  Bora Bost C. Lorin Mercy, M.D.    DATE OF BIRTH:  05-09-1940  DATE OF PROCEDURE:  09/23/2014 DATE OF DISCHARGE:                              OPERATIVE REPORT   PREOPERATIVE DIAGNOSIS:  Left hip osteoarthritis.  POSTOPERATIVE DIAGNOSIS:  Left hip osteoarthritis.  PROCEDURE:  Left direct anterior total hip arthroplasty.  SURGEON:  Cleopatra Sardo C. Lorin Mercy, M.D.  ASSISTANT:  Alyson Locket. Ricard Dillon, PA-C, medically necessary and present for the entire procedure.  ESTIMATED BLOOD LOSS:  Less than 500 mL.  ANESTHESIA:  General plus Marcaine local.  DESCRIPTION OF PROCEDURE:  After induction of general anesthesia, the patient was placed on the HANA table.  C-arm was brought in to confirm adequate alignment.  She was short on the operative side, she already had total hip arthroplasty in the opposite right side and due to the head erosion, flattening, no cartilage base, there was about 0.5 inch to 3/4 inch shortening.  After 10/15s were applied, usual DuraPrep, area was reapplied with blue split sheets, drapes, large shower curtain, Betadine, Steri-Drape, half sheet above and across.  Time-out procedure was completed.  Ancef was given prophylactically.  Incision was made anteriorly with palpation of the intertrochanter, ASIS, oblique incision over the tensor.  Fascia was nicked, extended with scissors, grasped with Allis clamp, and developed anteriorly until appropriate plane. Adult Cobra was placed over the top of the capsule, capsule was excised, and transverse bleeders were meticulously coagulated.  Anterior capsule was resected.  Hip was externally rotated to 80 degrees, continued capsule was taken down on the neck.  Head showed significant erosive changes under C-arm, the neck was cut with an oscillating saw, completed laterally with an  osteotome, and then the head was removed with corkscrew power initially and then by hand.  Erosive severe changes on both sides of the joint as expected.  Cup was repaired with sequential reaming, the last ream was 51, it was reamed under C-arm for appropriate positioning.  Permanent cup was inserted, single central hole and then apex hole eliminator was used for the 52 press-fit DePuy cup.  Posterior capsule was resected saving the external rotators.  Mueller was placed, the hydraulic hook was placed around the femur, leg was taken down and under external rotation 90 degrees, progressive preparation of the femur using the cookie cutter, osteotome, the chili pepper, and then sequential progression.  10 did not quite give a tight fit, 11 was selected and with some difficulty finally worked all the way down.  It was checked under AP and lateral fluoroscopy, was directly down the stem in satisfactory position.  The 11 stem was impacted and lacked about 2 mm sitting down, would not move, would not advance.  The broach had gotten all the way down.  Trial ball was inserted and with measurement leg length, it was exactly identical.  Trochanter was still attached, stem was directly down the middle of the canal on both AP and frog leg view and on AP pelvis, leg length was identical.  It was tapped down,  still was about 1.5 mm off the calcar.  Hip was dislocated.  Permanent metal ball was inserted to match the poly that had been placed after the apex hole eliminator.  Identical findings, final pictures were taken, and then standard closure with fascial closure with V-Loc suture 2-0 on the subcutaneous tissue, subcuticular skin closure, postop dressing, and transferred to the recovery room.  Instrument count and needle count was correct.     Qamar Aughenbaugh C. Lorin Mercy, M.D.     MCY/MEDQ  D:  09/23/2014  T:  09/24/2014  Job:  [id number]

## 2014-09-24 NOTE — Progress Notes (Signed)
Physical Therapy Treatment Patient Details Name: Marcia Leblanc MRN: [id number] DOB: 1940-05-28 Today's Date: 09/24/2014    History of Present Illness Patient is a 74 y/o female s/p L THA, anterior approach. PMH includes arthritis.    PT Comments    Pt reports increased pain this session & unable to tolerate increased ambulation distance.  RN notified & administered IV pain medication.    Follow Up Recommendations  Home health PT;Supervision/Assistance - 24 hour     Equipment Recommendations  Rolling walker with 5" wheels    Recommendations for Other Services       Precautions / Restrictions Precautions Precautions: Fall;Anterior Hip Precaution Booklet Issued: Yes (comment) Precaution Comments: anterior approach - does not specify direct anterior. Restrictions Weight Bearing Restrictions: No LLE Weight Bearing: Weight bearing as tolerated    Mobility  Bed Mobility Overal bed mobility: Needs Assistance Bed Mobility: Supine to Sit     Supine to sit: Min guard     General bed mobility comments: Incr time to transition supine>sitting EOB  Transfers Overall transfer level: Needs assistance Equipment used: Rolling walker (2 wheeled) Transfers: Sit to/from Stand Sit to Stand: Min guard         General transfer comment: cues for hand placement  Ambulation/Gait Ambulation/Gait assistance: Min guard Ambulation Distance (Feet): 15 Feet Assistive device: Rolling walker (2 wheeled) Gait Pattern/deviations: Step-to pattern;Decreased weight shift to left Gait velocity: decreased   General Gait Details: decreased tolerance for LLE WBing this session with pt only able to ambulate ~15'.     Stairs            Wheelchair Mobility    Modified Rankin (Stroke Patients Only)       Balance                                    Cognition Arousal/Alertness: Awake/alert Behavior During Therapy: WFL for tasks assessed/performed Overall Cognitive Status:  Within Functional Limits for tasks assessed                      Exercises      General Comments        Pertinent Vitals/Pain Pain Assessment: 0-10 Pain Score: 10-Worst pain ever Pain Location: lt hip Pain Descriptors / Indicators: Aching;Sharp Pain Intervention(s): Monitored during session;Limited activity within patient's tolerance;Repositioned (RN notified & administered IV pain medication)    Home Living Family/patient expects to be discharged to:: Private residence Living Arrangements: Spouse/significant other Available Help at Discharge: Family;Available 24 hours/day Type of Home: House Home Access: Stairs to enter Entrance Stairs-Rails: None Home Layout: One level Home Equipment: Walker - standard;Cane - single point;Bedside commode;Shower seat      Prior Function Level of Independence: Independent with assistive device(s)      Comments: Pt using [employer] PTA. Works as Armed forces operational officer.   PT Goals (current goals can now be found in the care plan section) Acute Rehab PT Goals Patient Stated Goal: home maybe tomorrow PT Goal Formulation: With patient Time For Goal Achievement: 10/07/14 Potential to Achieve Goals: Good Progress towards PT goals: Progressing toward goals    Frequency  7X/week    PT Plan Current plan remains appropriate    Co-evaluation PT/OT/SLP Co-Evaluation/Treatment: Yes Reason for Co-Treatment:  (pt anxiety) PT goals addressed during session: Mobility/safety with mobility;Balance;Proper use of DME OT goals addressed during session: ADL's and self-care;Strengthening/ROM     End of Session Equipment  Utilized During Treatment: Gait belt Activity Tolerance: Patient limited by pain Patient left: in chair;with call bell/phone within reach;with family/visitor present     Time: 2903-7955 PT Time Calculation (min) (ACUTE ONLY): 20 min  Charges:  $Gait Training: 8-22 mins                    G Codes:      Maniya, Donovan 09/24/2014,  2:10 PM   Sarajane Marek, PTA [phone] 09/24/2014

## 2014-09-24 NOTE — Care Management Note (Signed)
Case Management Note  Patient Details  Name: Marcia Leblanc MRN: [id number] Date of Birth: 1940/05/23  Subjective/Objective:   74 yo F underwent L THA anterior approach.                 Action/Plan: PT is recommending HHPT and 24 hr supervision   Expected Discharge Date: 09/25/14                  Expected Discharge Plan:  [place]  In-House Referral:     Discharge planning Services  CM Consult  Post Acute Care Choice:  Home Health Choice offered to:     DME Arranged:    DME Agency:     HH Arranged:  PT HH Agency:  [place]  Status of Service:  Completed, signed off  Medicare Important Message Given:    Date Medicare IM Given:    Medicare IM give by:    Date Additional Medicare IM Given:    Additional Medicare Important Message give by:     If discussed at [place] of Stay Meetings, dates discussed:    Additional Comments: met with pt and husband to discuss D/C plan and PT recommendations. She plans to return home with the support of her husband and friends. She has a rolling walker, 3 N 1 BSC, and a cane.  She used [facility] in the past and she wants to use them again. She had R hip surgery in the past. Unknown [facility] at [facility] for referral.  Norina Buzzard, RN 09/24/2014, 9:11 AM

## 2014-09-24 NOTE — Progress Notes (Signed)
Physical Therapy Treatment Patient Details Name: Marcia Leblanc MRN: [id number] DOB: 22-Jun-1940 Today's Date: 09/24/2014    History of Present Illness Patient is a 74 y/o female s/p L THA, anterior approach. PMH includes arthritis.    PT Comments    Pt progressing with mobility.  Increased ambulation distance.  Cont with current POC.   Follow Up Recommendations  Home health PT;Supervision/Assistance - 24 hour     Equipment Recommendations  Rolling walker with 5" wheels    Recommendations for Other Services       Precautions / Restrictions Precautions Precautions: Fall;Anterior Hip Precaution Booklet Issued: Yes (comment) Precaution Comments: anterior approach - does not specify direct anterior. Restrictions Weight Bearing Restrictions: No LLE Weight Bearing: Weight bearing as tolerated    Mobility  Bed Mobility Overal bed mobility: Needs Assistance Bed Mobility: Supine to Sit     Supine to sit: Min guard     General bed mobility comments: Guarding for safety.  Pt requesting to perform without assistance.  Increased time but able to complete without assistance from therapist.  Cues for technqiue  Transfers Overall transfer level: Needs assistance Equipment used: Rolling walker (2 wheeled) Transfers: Sit to/from Stand Sit to Stand: Min guard         General transfer comment: cues for hand placement  Ambulation/Gait Ambulation/Gait assistance: Min guard Ambulation Distance (Feet): 40 Feet Assistive device: Rolling walker (2 wheeled) Gait Pattern/deviations: Step-to pattern;Step-through pattern;Decreased weight shift to left;Antalgic Gait velocity: decreased   General Gait Details: slow, guarded gait.  Cues for sequencing & encouragement for increased WBing LLE    Stairs            Wheelchair Mobility    Modified Rankin (Stroke Patients Only)       Balance                                    Cognition Arousal/Alertness:  Awake/alert Behavior During Therapy: WFL for tasks assessed/performed Overall Cognitive Status: Within Functional Limits for tasks assessed                      Exercises      General Comments        Pertinent Vitals/Pain Pain Assessment: 0-10 Pain Score: 7  Pain Location: Lt hip Pain Descriptors / Indicators: Aching;Spasm Pain Intervention(s): Monitored during session;Repositioned    Home Living Family/patient expects to be discharged to:: Private residence Living Arrangements: Spouse/significant other Available Help at Discharge: Family;Available 24 hours/day Type of Home: House Home Access: Stairs to enter Entrance Stairs-Rails: None Home Layout: One level Home Equipment: Walker - standard;Cane - single point;Bedside commode;Shower seat      Prior Function Level of Independence: Independent with assistive device(s)      Comments: Pt using [employer] PTA. Works as Armed forces operational officer.   PT Goals (current goals can now be found in the care plan section) Acute Rehab PT Goals Patient Stated Goal: home maybe tomorrow PT Goal Formulation: With patient Time For Goal Achievement: 10/07/14 Potential to Achieve Goals: Good Progress towards PT goals: Progressing toward goals    Frequency  7X/week    PT Plan Current plan remains appropriate    Co-evaluation PT/OT/SLP Co-Evaluation/Treatment: Yes Reason for Co-Treatment:  (pt anxiety) PT goals addressed during session: Mobility/safety with mobility;Balance;Proper use of DME OT goals addressed during session: ADL's and self-care;Strengthening/ROM     End of Session Equipment Utilized During Treatment:  Gait belt Activity Tolerance: Patient tolerated treatment well Patient left: in chair;with call bell/phone within reach;with family/visitor present     Time: 6184-8592 PT Time Calculation (min) (ACUTE ONLY): 28 min  Charges:  $Gait Training: 8-22 mins                    G Codes:      Marcia Leblanc, Marcia Leblanc 09/24/2014,  1:26 PM   Marcia Leblanc, [place] [phone] 09/24/2014

## 2014-09-25 LAB — BASIC METABOLIC PANEL
Anion gap: 8 (ref 5–15)
BUN: 5 mg/dL — AB (ref 6–20)
CHLORIDE: 101 mmol/L (ref 101–111)
CO2: 25 mmol/L (ref 22–32)
CREATININE: 0.71 mg/dL (ref 0.44–1.00)
Calcium: 8.6 mg/dL — ABNORMAL LOW (ref 8.9–10.3)
GFR calc Af Amer: >60 mL/min (ref >60)
GLUCOSE: 109 mg/dL — AB (ref 65–99)
Potassium: 3.7 mmol/L (ref 3.5–5.1)
Sodium: 134 mmol/L — ABNORMAL LOW (ref 135–145)

## 2014-09-25 LAB — CBC
HEMATOCRIT: 28.9 % — AB (ref 36.0–46.0)
HEMOGLOBIN: 9.9 g/dL — AB (ref 12.0–15.0)
MCH: 29.7 pg (ref 26.0–34.0)
MCHC: 34.3 g/dL (ref 30.0–36.0)
MCV: 86.8 fL (ref 78.0–100.0)
Platelets: 177 10*3/uL (ref 150–400)
RBC: 3.33 MIL/uL — AB (ref 3.87–5.11)
RDW: 14.8 % (ref 11.5–15.5)
WBC: 10.3 10*3/uL (ref 4.0–10.5)

## 2014-09-25 MED ORDER — OXYCODONE-ACETAMINOPHEN 5-325 MG PO TABS: 2.0000 | ORAL_TABLET | ORAL | Status: DC | PRN

## 2014-09-25 MED ORDER — ASPIRIN 325 MG PO TABS: 325.0000 mg | ORAL_TABLET | Freq: Every day | ORAL | Status: DC

## 2014-09-25 NOTE — Progress Notes (Signed)
Occupational Therapy Treatment and Discharge Patient Details Name: Marcia Leblanc MRN: [id number] DOB: 02-08-1941 Today's Date: 09/25/2014    History of present illness Patient is a 74 y/o female s/p L THA, anterior approach. PMH includes arthritis.   OT comments  This 74 yo female admitted and underwent above presents to acute OT with all education completed, we will D/C from acute OT.  Follow Up Recommendations  No OT follow up    Equipment Recommendations  None recommended by OT       Precautions / Restrictions Precautions Precautions: Fall;Anterior Hip Precaution Comments: There is a contradiction in between the OP/PN notes and the order set so we are going with the more conservative approach of anterior precautions Restrictions Weight Bearing Restrictions: No LLE Weight Bearing: Weight bearing as tolerated       Mobility Bed Mobility Overal bed mobility: Needs Assistance Bed Mobility: Supine to Sit     Supine to sit: Supervision     General bed mobility comments: Incr time to transition supine>sitting EOB  Transfers Overall transfer level: Needs assistance Equipment used: Rolling walker (2 wheeled) Transfers: Sit to/from Stand Sit to Stand: Min guard         General transfer comment: cues for safe hand placement    Balance Overall balance assessment: Needs assistance Sitting-balance support: Feet supported;No upper extremity supported Sitting balance-Leahy Scale: Fair     Standing balance support: Bilateral upper extremity supported;During functional activity Standing balance-Leahy Scale: Fair                     ADL Overall ADL's : Needs assistance/impaired     Grooming: Wash/dry hands;Min Dispensing optician: Min guard;Ambulation;RW;BSC (over toilet)   Toileting- Clothing Manipulation and Hygiene: Min guard;Sit to/from stand   Tub/ Shower Transfer: Tub transfer;Minimal assistance;Ambulation;Rolling  walker (stepping in backwards over tub to then sit on seat with husband A'ing with transfer and placement of RW)     General ADL Comments: Made husband and pt aware that husband needed to be with pt anytime she was up on her feet until he felt comfortable let her be up by herself      Vision                 Additional Comments: No change from baseline          Cognition   Behavior During Therapy: [facility] for tasks assessed/performed Overall Cognitive Status: Within Functional Limits for tasks assessed                                    Pertinent Vitals/ Pain       Pain Assessment: 0-10 Pain Score: 6  Pain Location: left hip Pain Descriptors / Indicators: Sore;Spasm (with first 2-3 steps) Pain Intervention(s): Monitored during session;Repositioned;Patient requesting pain meds-RN notified;RN gave pain meds during session         Frequency Min 2X/week     Progress Toward Goals  OT Goals(current goals can now be found in the care plan section)  Progress towards OT goals: Progressing toward goals (all education completed)     Plan Discharge plan remains appropriate       End of Session Equipment Utilized During Treatment: Rolling walker   Activity Tolerance Patient tolerated treatment well   Patient Left in chair;with  family/visitor present (in gym awaiting PT for stair training)   Nurse Communication Patient requests pain meds        Time: 2947-6546 OT Time Calculation (min): 44 min  Charges: OT General Charges $OT Visit: 1 Procedure OT Treatments $Self Care/Home Management : 38-52 mins  Almon Register [phone] 09/25/2014, 10:18 AM

## 2014-09-25 NOTE — Progress Notes (Signed)
Physical Therapy Treatment Patient Details Name: Marcia Leblanc MRN: [id number] DOB: [birth year] Today's Date: 09/25/2014    History of Present Illness Patient is a 74 y/o female s/p L THA, anterior approach. PMH includes arthritis.    PT Comments    Session focused on increasing ambulation distance and stair navigation with 2-wheeled rolling walker and husband present in order to safely discharge home. Pt significantly improved ambulation distance from previous session and demonstrated understanding of navigating stairs in order to enter her home. Continue to recommend discharge home with home health PT.   Follow Up Recommendations  Home health PT;Supervision/Assistance - 24 hour     Equipment Recommendations  Rolling walker with 5" wheels    Recommendations for Other Services       Precautions / Restrictions Precautions Precautions: Fall;Anterior Hip Precaution Comments: There is a contradiction in between the OP/PN notes and the order set so we are going with the more conservative approach of anterior precautions Restrictions Weight Bearing Restrictions: No LLE Weight Bearing: Weight bearing as tolerated    Mobility  Bed Mobility Overal bed mobility: Needs Assistance Bed Mobility: Supine to Sit     Supine to sit: Supervision     General bed mobility comments: Incr time to transition supine>sitting EOB  Transfers Overall transfer level: Needs assistance Equipment used: Rolling walker (2 wheeled) Transfers: Sit to/from Stand Sit to Stand: Min guard         General transfer comment: Minimal cues for safe hand placement  Ambulation/Gait Ambulation/Gait assistance: Min guard  Gait Distance: Aprrox. 100 feet Assistive device: Rolling walker (2 wheeled) Gait Pattern/deviations: Step-to pattern;Decreased step length - right;Decreased stance time - right;Decreased weight shift to left;Antalgic Gait velocity: decreased Gait velocity interpretation: Below normal speed  for age/gender General Gait Details: Pt tolerated LLE WBing well during this session. Required some muscle priming during standing prior to initiating gait due to muscle pain and spasm. Pt made a distance goal and achieved that goal after one rest break in the chair. Much improved distance from previous session.    Stairs Stairs: Yes Stairs assistance: Min guard Stair Management: No rails;Backwards;Step to pattern;With walker Number of Stairs: 6 General stair comments: Pt's husband was present during session and he was included in stair education. Both demonstrated understanding of the task.  Wheelchair Mobility    Modified Rankin (Stroke Patients Only)       Balance Overall balance assessment: Needs assistance Sitting-balance support: Feet supported;No upper extremity supported Sitting balance-Leahy Scale: Fair     Standing balance support: Bilateral upper extremity supported;During functional activity Standing balance-Leahy Scale: Fair                      Cognition Arousal/Alertness: Awake/alert Behavior During Therapy: WFL for tasks assessed/performed Overall Cognitive Status: Within Functional Limits for tasks assessed                      Exercises      General Comments        Pertinent Vitals/Pain Pain Assessment: 0-10 Pain Score: 7  (4/10 at beginning of treatment and 7/10 during ambulation) Pain Location: Left hip Pain Descriptors / Indicators: Sore;Spasm Pain Intervention(s): Monitored during session;Premedicated before session    Home Living                      Prior Function            PT Goals (current goals can now  be found in the care plan section) Acute Rehab PT Goals Patient Stated Goal: Home maybe today PT Goal Formulation: With patient Time For Goal Achievement: 10/07/14 Potential to Achieve Goals: Good Progress towards PT goals: Progressing toward goals    Frequency  7X/week    PT Plan Current plan  remains appropriate    Co-evaluation     PT goals addressed during session: Mobility/safety with mobility;Proper use of DME       End of Session Equipment Utilized During Treatment: Gait belt Activity Tolerance: Patient tolerated treatment well;Patient limited by fatigue Patient left: in chair;with call bell/phone within reach;with family/visitor present     Time: 9826-4158 PT Time Calculation (min) (ACUTE ONLY): 21 min  Charges:                       G CodesShawna Orleans, SPT Acute Rehabilitation Services Office: [phone]  Shawna Orleans 09/25/2014, 10:48 AM

## 2014-09-25 NOTE — Progress Notes (Signed)
Subjective: 2 Days Post-Op Procedure(s) (LRB): TOTAL HIP ARTHROPLASTY ANTERIOR APPROACH (Left) Patient reports pain as 4 on 0-10 scale.    Objective: Vital signs in last 24 hours: Temp:  [98.2 F (36.8 C)-99.1 F (37.3 C)] 98.2 F (36.8 C) (06/05 0505) Pulse Rate:  [88-105] 100 (06/05 0505) Resp:  [14-17] 17 (06/05 0505) BP: (107-124)/(61-68) 114/61 mmHg (06/05 0505) SpO2:  [93 %-96 %] 96 % (06/05 0505)  Intake/Output from previous day: 06/04 0701 - 06/05 0700 In: 120 [P.O.:120] Out: 300 [Urine:300] Intake/Output this shift:     Recent Labs  09/24/14 0605  HGB 9.7*    Recent Labs  09/24/14 0605  WBC 7.5  RBC 3.32*  HCT 28.7*  PLT 143*    Recent Labs  09/24/14 0605  NA 136  K 3.7  CL 103  CO2 23  BUN 6  CREATININE 0.78  GLUCOSE 109*  CALCIUM 8.5*   No results for input(s): LABPT, INR in the last 72 hours.  Neurologically intact  Assessment/Plan: 2 Days Post-Op Procedure(s) (LRB): TOTAL HIP ARTHROPLASTY ANTERIOR APPROACH (Left) Up with therapy possible home today or tomorrow. She states she still feels a little unsteady on her left leg and has not done stairs yet.  D/c SL  YATES,MARK C 09/25/2014, 6:34 AM

## 2014-09-26 ENCOUNTER — Encounter (HOSPITAL_COMMUNITY): Payer: Self-pay | Admitting: Orthopaedic Surgery

## 2014-09-26 DIAGNOSIS — Z87891 Personal history of nicotine dependence: Secondary | ICD-10-CM | POA: Diagnosis not present

## 2014-09-26 DIAGNOSIS — Z96642 Presence of left artificial hip joint: Secondary | ICD-10-CM | POA: Diagnosis not present

## 2014-09-26 DIAGNOSIS — Z471 Aftercare following joint replacement surgery: Secondary | ICD-10-CM | POA: Diagnosis not present

## 2014-09-28 DIAGNOSIS — Z96642 Presence of left artificial hip joint: Secondary | ICD-10-CM | POA: Diagnosis not present

## 2014-09-28 DIAGNOSIS — Z87891 Personal history of nicotine dependence: Secondary | ICD-10-CM | POA: Diagnosis not present

## 2014-09-28 DIAGNOSIS — Z471 Aftercare following joint replacement surgery: Secondary | ICD-10-CM | POA: Diagnosis not present

## 2014-09-30 DIAGNOSIS — Z471 Aftercare following joint replacement surgery: Secondary | ICD-10-CM | POA: Diagnosis not present

## 2014-09-30 DIAGNOSIS — Z87891 Personal history of nicotine dependence: Secondary | ICD-10-CM | POA: Diagnosis not present

## 2014-09-30 DIAGNOSIS — Z96642 Presence of left artificial hip joint: Secondary | ICD-10-CM | POA: Diagnosis not present

## 2014-10-04 DIAGNOSIS — Z96642 Presence of left artificial hip joint: Secondary | ICD-10-CM | POA: Diagnosis not present

## 2014-10-04 DIAGNOSIS — Z471 Aftercare following joint replacement surgery: Secondary | ICD-10-CM | POA: Diagnosis not present

## 2014-10-04 DIAGNOSIS — Z87891 Personal history of nicotine dependence: Secondary | ICD-10-CM | POA: Diagnosis not present

## 2014-10-06 DIAGNOSIS — Z96642 Presence of left artificial hip joint: Secondary | ICD-10-CM | POA: Diagnosis not present

## 2014-10-06 DIAGNOSIS — Z87891 Personal history of nicotine dependence: Secondary | ICD-10-CM | POA: Diagnosis not present

## 2014-10-06 DIAGNOSIS — Z471 Aftercare following joint replacement surgery: Secondary | ICD-10-CM | POA: Diagnosis not present

## 2014-10-07 DIAGNOSIS — M1612 Unilateral primary osteoarthritis, left hip: Secondary | ICD-10-CM | POA: Diagnosis not present

## 2014-10-10 DIAGNOSIS — Z471 Aftercare following joint replacement surgery: Secondary | ICD-10-CM | POA: Diagnosis not present

## 2014-10-10 DIAGNOSIS — Z87891 Personal history of nicotine dependence: Secondary | ICD-10-CM | POA: Diagnosis not present

## 2014-10-10 DIAGNOSIS — Z96642 Presence of left artificial hip joint: Secondary | ICD-10-CM | POA: Diagnosis not present

## 2014-10-12 NOTE — Discharge Summary (Signed)
Patient ID: Marcia Leblanc MRN: [id number] DOB/AGE: March 28, 1941 74 y.o.  Admit date: 09/23/2014 Discharge date: 10/12/2014  Admission Diagnoses:  Active Problems:   Status post total replacement of left hip   Discharge Diagnoses:  Active Problems:   Status post total replacement of left hip  status post Procedure(s): TOTAL HIP ARTHROPLASTY ANTERIOR APPROACH  Past Medical History  Diagnosis Date  . Arthritis     Surgeries: Procedure(s): TOTAL HIP ARTHROPLASTY ANTERIOR APPROACH on 09/23/2014   Consultants:    Discharged Condition: Improved  Hospital Course: Marcia Leblanc is an 74 y.o. female who was admitted 09/23/2014 for operative treatment of hip djd. Patient failed conservative treatments (please see the history and physical for the specifics) and had severe unremitting pain that affects sleep, daily activities and work/hobbies. After pre-op clearance, the patient was taken to the operating room on 09/23/2014 and underwent  Procedure(s): TOTAL HIP ARTHROPLASTY ANTERIOR APPROACH.    Patient was given perioperative antibiotics:  Anti-infectives    Start     Dose/Rate Route Frequency Ordered Stop   09/23/14 1930  ceFAZolin (ANCEF) IVPB 2 g/50 mL premix     2 g 100 mL/hr over 30 Minutes Intravenous  Once 09/23/14 1908 09/23/14 2219   09/23/14 0645  ceFAZolin (ANCEF) IVPB 2 g/50 mL premix     2 g 100 mL/hr over 30 Minutes Intravenous To Surgery 09/22/14 6808 09/23/14 0740       Patient was given sequential compression devices and early ambulation to prevent DVT.   Patient benefited maximally from hospital stay and there were no complications. At the time of discharge, the patient was urinating/moving their bowels without difficulty, tolerating a regular diet, pain is controlled with oral pain medications and they have been cleared by PT/OT.   Recent vital signs: No data found.    Recent laboratory studies: No results for input(s): WBC, HGB, HCT, PLT, NA, K, CL, CO2, BUN,  CREATININE, GLUCOSE, INR, CALCIUM in the last 72 hours.  Invalid input(s): PT, 2   Discharge Medications:     Medication List    STOP taking these medications        acetaminophen 500 MG tablet  Commonly known as:  TYLENOL      TAKE these medications        aspirin 325 MG tablet  Commonly known as:  BAYER ASPIRIN  Take 1 tablet (325 mg total) by mouth daily.     multivitamin-lutein Caps capsule  Take 1 capsule by mouth daily.     oxyCODONE-acetaminophen 5-325 MG per tablet  Commonly known as:  ROXICET  Take 2 tablets by mouth every 4 (four) hours as needed for severe pain.     Vitamin D3 5000 UNITS Tabs  Take 5,000 Units by mouth daily.     ZICAM COLD REMEDY PO  Take by mouth.        Diagnostic Studies: Dg Chest 2 View  09/12/2014   CLINICAL DATA:  Preoperative left hip surgery. Recent fever and chills  EXAM: CHEST  2 VIEW  COMPARISON:  None.  FINDINGS: There is subsegmental atelectasis in both lower lung zones, marginally more on the left than on the right. Lungs elsewhere clear. Heart size and pulmonary vascularity are normal. No adenopathy. There is atherosclerotic change in the aortic arch. There is degenerative change in the thoracic spine.  IMPRESSION: Mild bibasilar atelectatic change.  No frank edema or consolidation.   Electronically Signed   By: Lowella Grip III M.D.   On:  09/12/2014 10:02   Dg Hip Operative Unilat With Pelvis Left  09/23/2014   CLINICAL DATA:  Hip replacement.  Initial encounter.  EXAM: OPERATIVE left HIP (WITH PELVIS IF PERFORMED)  VIEWS  TECHNIQUE: Fluoroscopic spot image(s) were submitted for interpretation post-operatively.  : COMPARISON:  07/14/2014.  FINDINGS: Three spot fluoro images submitted after the procedure. Patient is status post left total hip replacement. No evidence for hardware complications.  IMPRESSION: No evidence for complicating features status post left hip replacement.   Electronically Signed   By: Misty Stanley M.D.    On: 09/23/2014 09:33          Follow-up Information    Follow up with [facility].   Why:  home health physical therapy   Contact information:   [address] [place] [address] [phone]       Discharge Plan:  discharge to home  Disposition:     Signed: Lanae Crumbly  10/12/2014, 7:22 AM

## 2014-10-14 DIAGNOSIS — Z96642 Presence of left artificial hip joint: Secondary | ICD-10-CM | POA: Diagnosis not present

## 2014-10-14 DIAGNOSIS — Z471 Aftercare following joint replacement surgery: Secondary | ICD-10-CM | POA: Diagnosis not present

## 2014-10-14 DIAGNOSIS — Z87891 Personal history of nicotine dependence: Secondary | ICD-10-CM | POA: Diagnosis not present

## 2014-12-06 DIAGNOSIS — L720 Epidermal cyst: Secondary | ICD-10-CM | POA: Diagnosis not present

## 2014-12-13 DIAGNOSIS — L728 Other follicular cysts of the skin and subcutaneous tissue: Secondary | ICD-10-CM | POA: Diagnosis not present

## 2014-12-27 DIAGNOSIS — M1612 Unilateral primary osteoarthritis, left hip: Secondary | ICD-10-CM | POA: Diagnosis not present

## 2015-05-16 DIAGNOSIS — M5137 Other intervertebral disc degeneration, lumbosacral region: Secondary | ICD-10-CM | POA: Diagnosis not present

## 2015-05-16 DIAGNOSIS — M1612 Unilateral primary osteoarthritis, left hip: Secondary | ICD-10-CM | POA: Diagnosis not present

## 2015-05-16 DIAGNOSIS — M545 Low back pain: Secondary | ICD-10-CM | POA: Diagnosis not present

## 2015-11-22 DIAGNOSIS — M25552 Pain in left hip: Secondary | ICD-10-CM | POA: Diagnosis not present

## 2015-11-24 DIAGNOSIS — M25552 Pain in left hip: Secondary | ICD-10-CM | POA: Diagnosis not present

## 2015-11-27 DIAGNOSIS — M25552 Pain in left hip: Secondary | ICD-10-CM | POA: Diagnosis not present

## 2015-11-30 DIAGNOSIS — M25552 Pain in left hip: Secondary | ICD-10-CM | POA: Diagnosis not present

## 2015-12-04 DIAGNOSIS — M25552 Pain in left hip: Secondary | ICD-10-CM | POA: Diagnosis not present

## 2015-12-07 DIAGNOSIS — M25552 Pain in left hip: Secondary | ICD-10-CM | POA: Diagnosis not present

## 2015-12-14 DIAGNOSIS — M25552 Pain in left hip: Secondary | ICD-10-CM | POA: Diagnosis not present

## 2015-12-18 DIAGNOSIS — M25552 Pain in left hip: Secondary | ICD-10-CM | POA: Diagnosis not present

## 2015-12-20 DIAGNOSIS — H9190 Unspecified hearing loss, unspecified ear: Secondary | ICD-10-CM | POA: Diagnosis not present

## 2015-12-21 DIAGNOSIS — M25552 Pain in left hip: Secondary | ICD-10-CM | POA: Diagnosis not present

## 2016-01-08 DIAGNOSIS — M549 Dorsalgia, unspecified: Secondary | ICD-10-CM | POA: Diagnosis not present

## 2016-01-08 DIAGNOSIS — Z6825 Body mass index (BMI) 25.0-25.9, adult: Secondary | ICD-10-CM | POA: Diagnosis not present

## 2016-01-08 DIAGNOSIS — E559 Vitamin D deficiency, unspecified: Secondary | ICD-10-CM | POA: Diagnosis not present

## 2016-01-08 DIAGNOSIS — Z0000 Encounter for general adult medical examination without abnormal findings: Secondary | ICD-10-CM | POA: Diagnosis not present

## 2016-01-08 DIAGNOSIS — M8580 Other specified disorders of bone density and structure, unspecified site: Secondary | ICD-10-CM | POA: Diagnosis not present

## 2016-01-08 DIAGNOSIS — Z1389 Encounter for screening for other disorder: Secondary | ICD-10-CM | POA: Diagnosis not present

## 2016-01-10 DIAGNOSIS — M8580 Other specified disorders of bone density and structure, unspecified site: Secondary | ICD-10-CM | POA: Diagnosis not present

## 2016-01-10 DIAGNOSIS — M549 Dorsalgia, unspecified: Secondary | ICD-10-CM | POA: Diagnosis not present

## 2016-01-10 DIAGNOSIS — E559 Vitamin D deficiency, unspecified: Secondary | ICD-10-CM | POA: Diagnosis not present

## 2016-01-18 DIAGNOSIS — H25813 Combined forms of age-related cataract, bilateral: Secondary | ICD-10-CM | POA: Diagnosis not present

## 2016-01-18 DIAGNOSIS — H35033 Hypertensive retinopathy, bilateral: Secondary | ICD-10-CM | POA: Diagnosis not present

## 2016-01-18 DIAGNOSIS — H04123 Dry eye syndrome of bilateral lacrimal glands: Secondary | ICD-10-CM | POA: Diagnosis not present

## 2016-01-18 DIAGNOSIS — H40013 Open angle with borderline findings, low risk, bilateral: Secondary | ICD-10-CM | POA: Diagnosis not present

## 2016-01-31 DIAGNOSIS — M85832 Other specified disorders of bone density and structure, left forearm: Secondary | ICD-10-CM | POA: Diagnosis not present

## 2016-01-31 DIAGNOSIS — M818 Other osteoporosis without current pathological fracture: Secondary | ICD-10-CM | POA: Diagnosis not present

## 2016-04-17 DIAGNOSIS — R05 Cough: Secondary | ICD-10-CM | POA: Diagnosis not present

## 2016-05-25 IMAGING — CR DG CHEST 2V
2 series · 2 of 2 positions shown · non-contrast
Comparison: None.

CLINICAL DATA: Preoperative left hip surgery. Recent fever and
chills

EXAM:
CHEST  2 VIEW

[w chest pa]
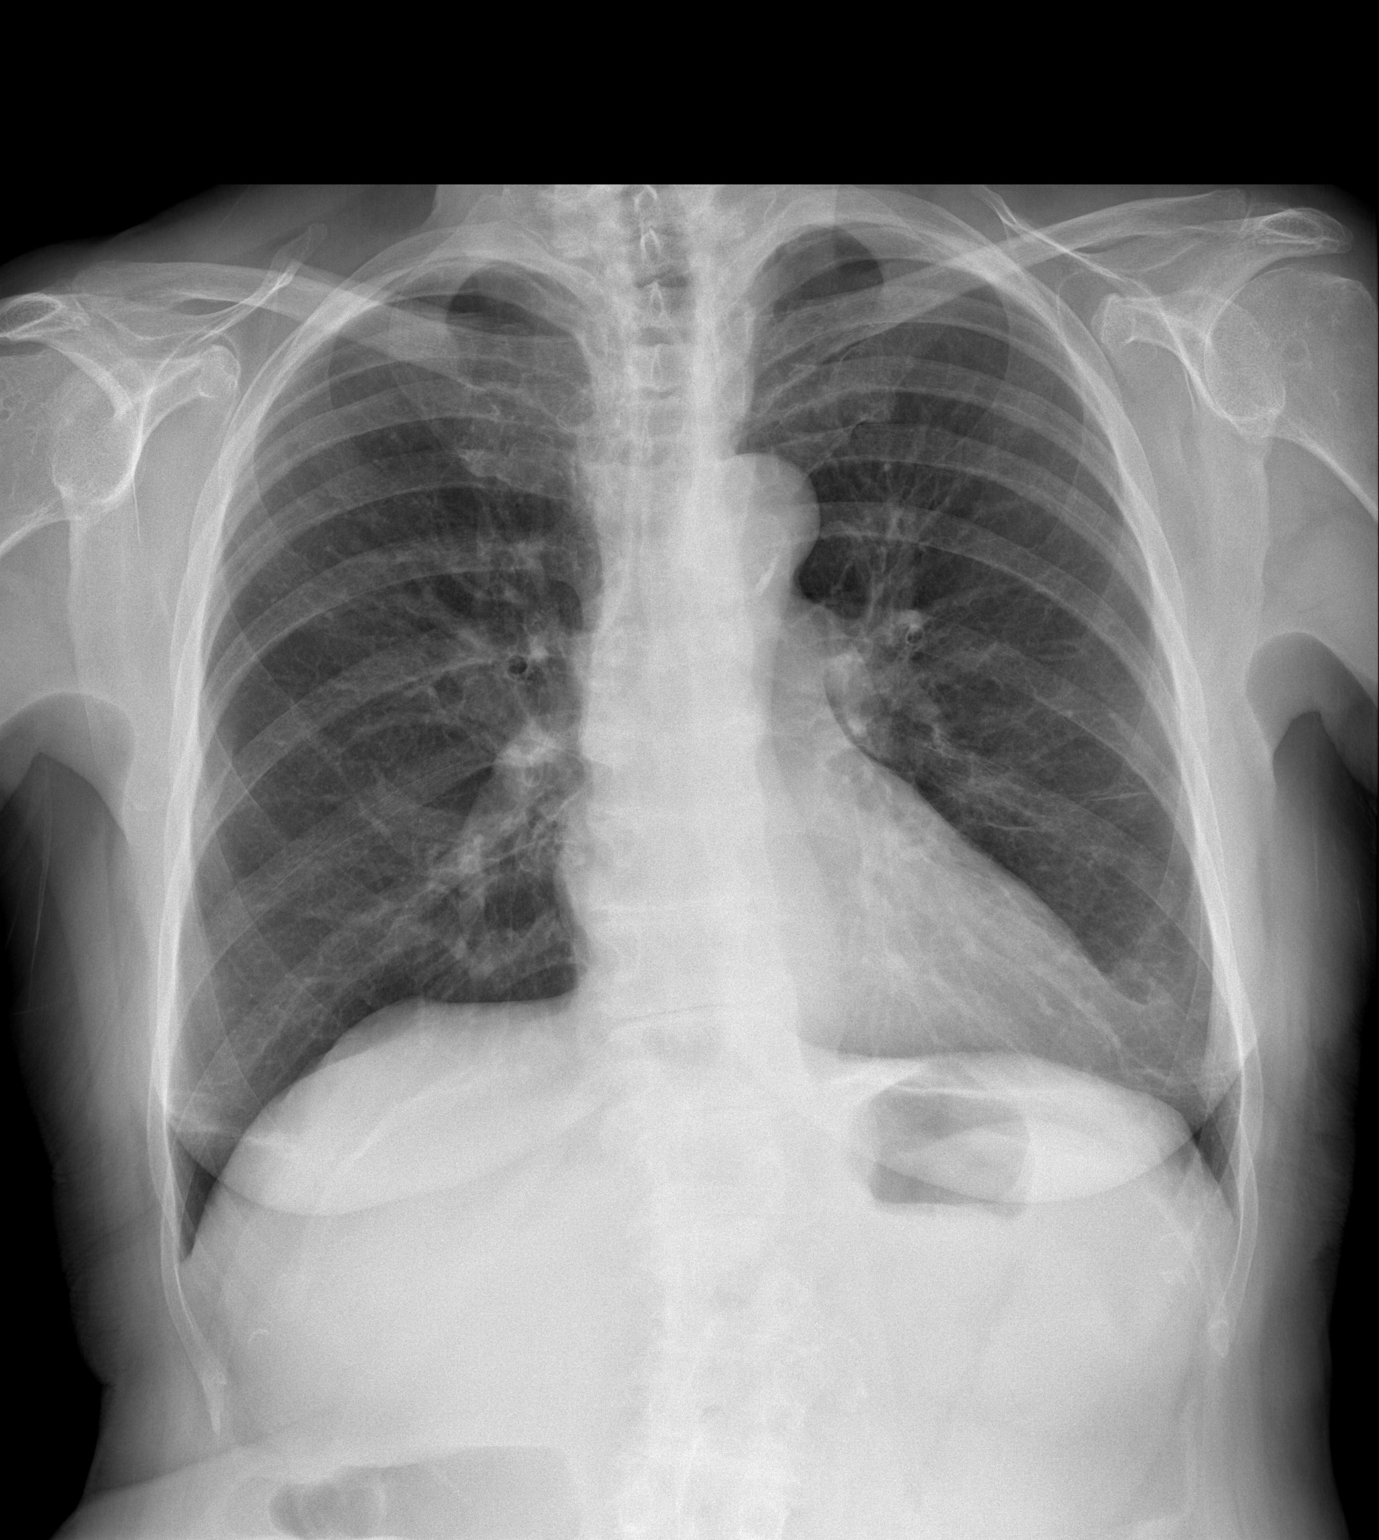

[w chest lat]
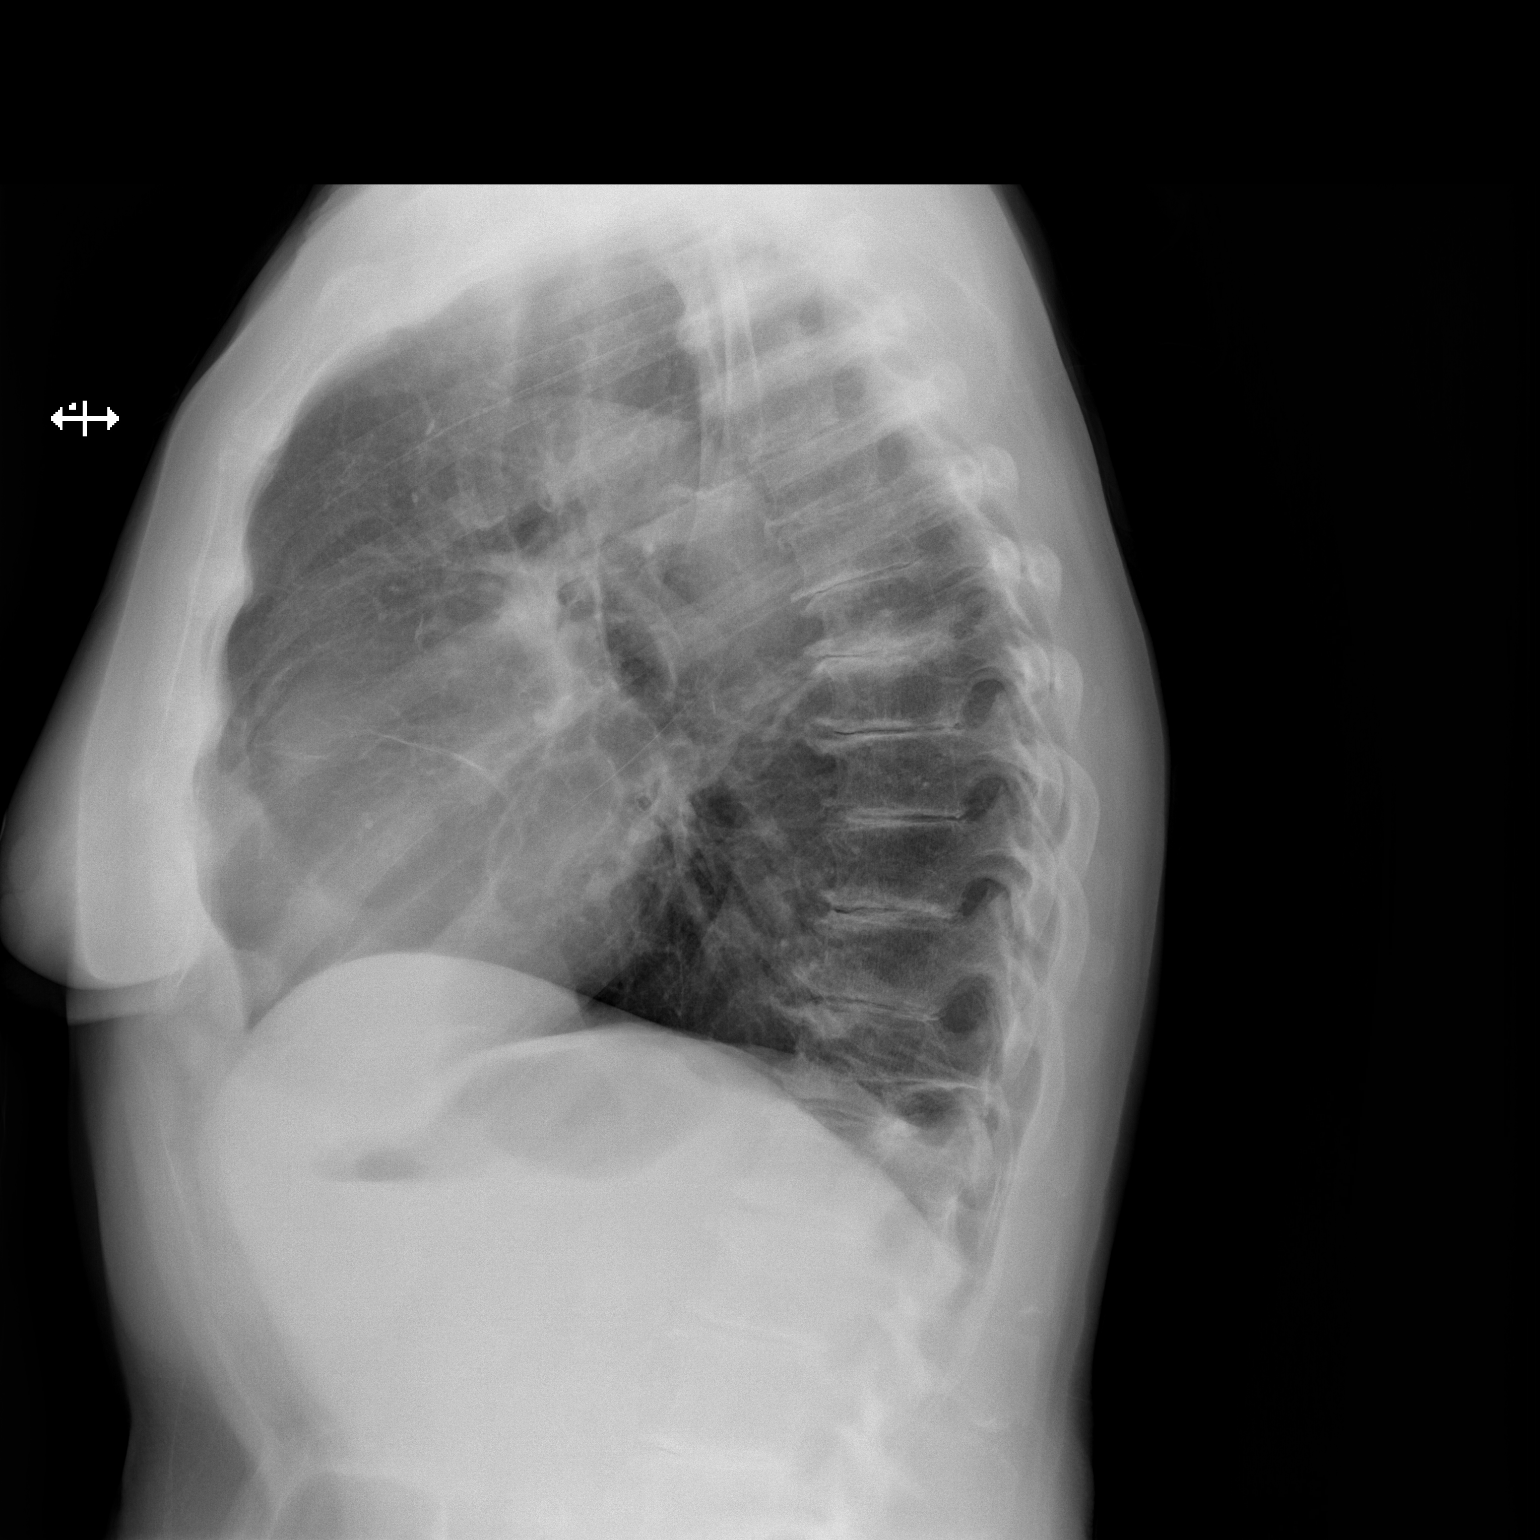

[2 of 2 positions shown; findings below may reference images not displayed]

FINDINGS: There is subsegmental atelectasis in both lower lung zones,
marginally more on the left than on the right. Lungs elsewhere
clear. Heart size and pulmonary vascularity are normal. No
adenopathy. There is atherosclerotic change in the aortic arch.
There is degenerative change in the thoracic spine.
IMPRESSION: Mild bibasilar atelectatic change.  No frank edema or consolidation.

## 2016-06-05 IMAGING — RF DG HIP (WITH PELVIS) OPERATIVE*L*
1 series · 3 of 3 positions shown · non-contrast
Comparison: 07/14/2014.

CLINICAL DATA: Hip replacement.  Initial encounter.

EXAM:
OPERATIVE left HIP (WITH PELVIS IF PERFORMED)  VIEWS
TECHNIQUE: Fluoroscopic spot image(s) were submitted for interpretation
post-operatively.
:

[Series 1: run · 3 of 3 slices shown]
[im 1/3]
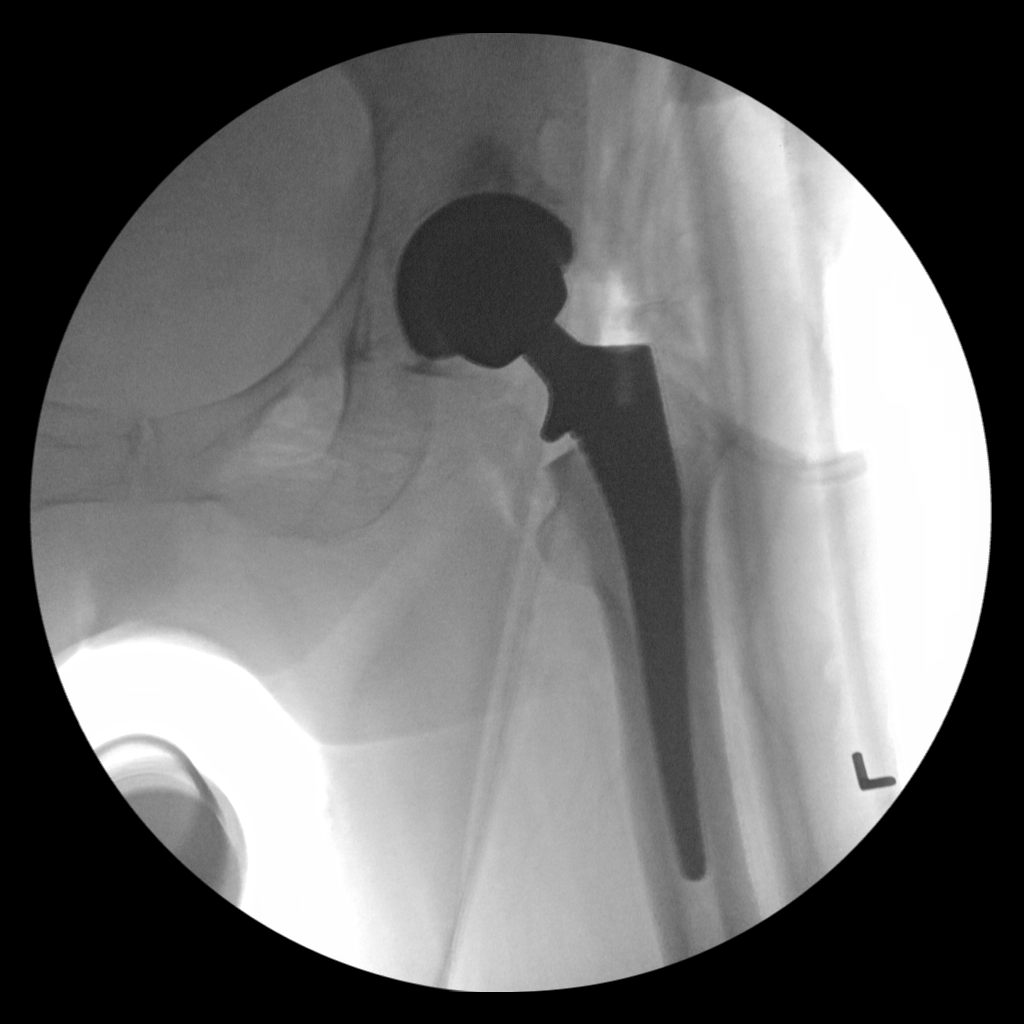
[im 2/3]
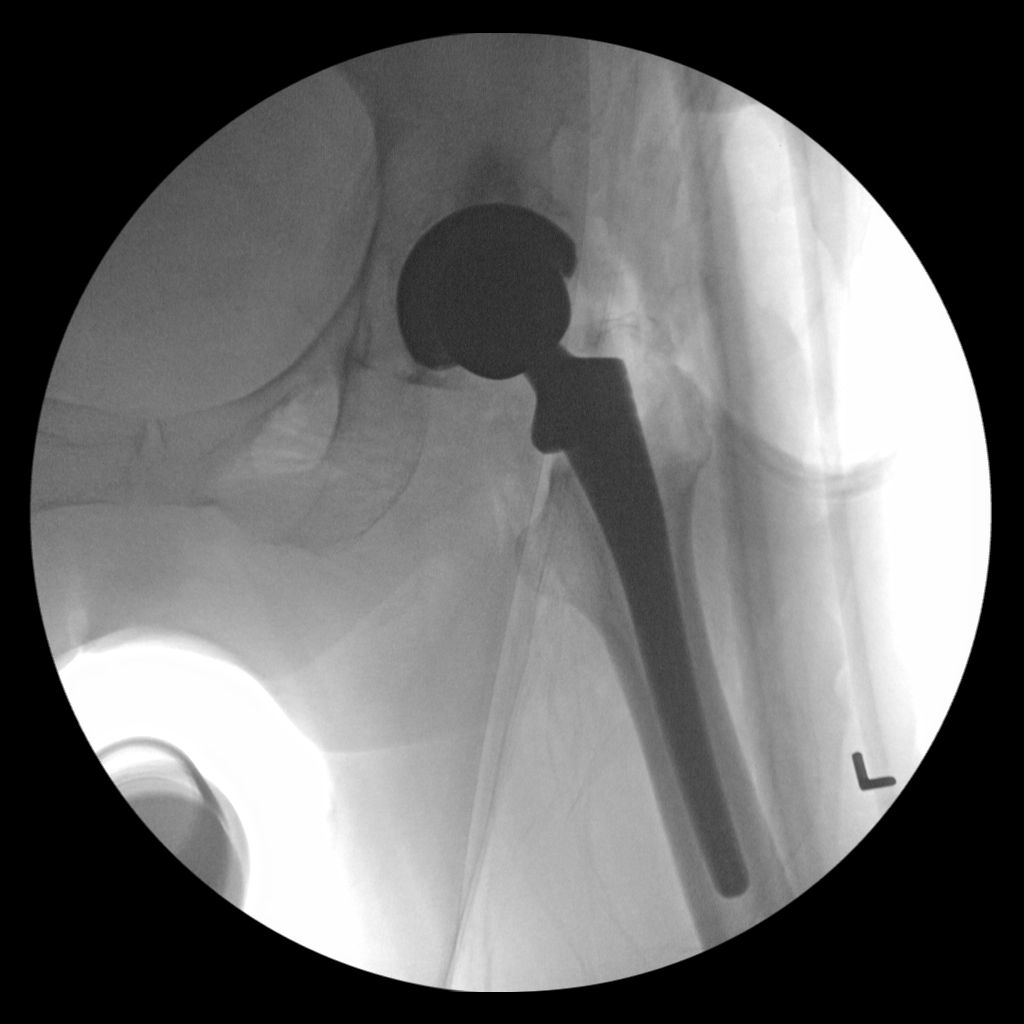
[im 3/3]
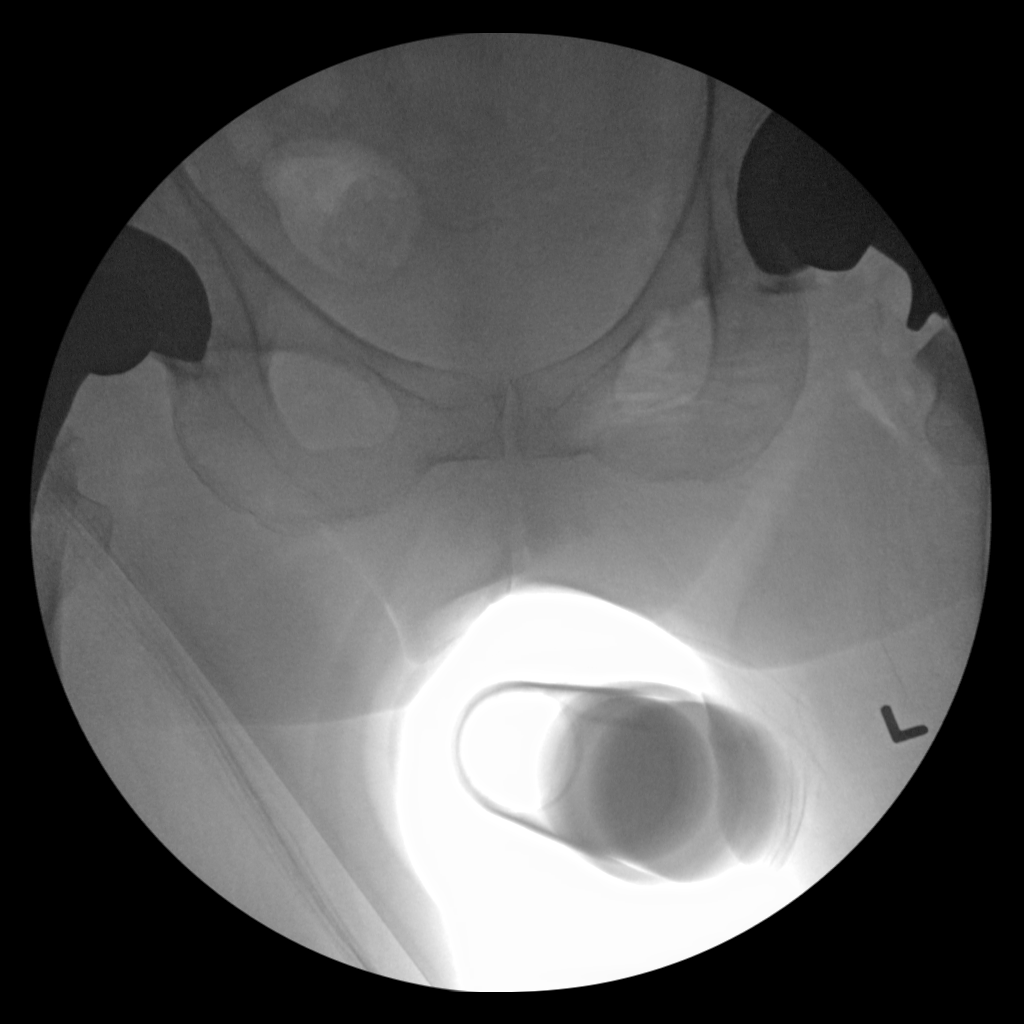

[3 of 3 positions shown; findings below may reference images not displayed]

FINDINGS: Three spot fluoro images submitted after the procedure. Patient is
status post left total hip replacement. No evidence for hardware
complications.
IMPRESSION: No evidence for complicating features status post left hip
replacement.

## 2017-01-20 DIAGNOSIS — Z0000 Encounter for general adult medical examination without abnormal findings: Secondary | ICD-10-CM | POA: Diagnosis not present

## 2017-01-20 DIAGNOSIS — E559 Vitamin D deficiency, unspecified: Secondary | ICD-10-CM | POA: Diagnosis not present

## 2017-01-20 DIAGNOSIS — M8580 Other specified disorders of bone density and structure, unspecified site: Secondary | ICD-10-CM | POA: Diagnosis not present

## 2017-01-20 DIAGNOSIS — M549 Dorsalgia, unspecified: Secondary | ICD-10-CM | POA: Diagnosis not present

## 2017-01-20 DIAGNOSIS — Z6826 Body mass index (BMI) 26.0-26.9, adult: Secondary | ICD-10-CM | POA: Diagnosis not present

## 2017-01-22 DIAGNOSIS — E559 Vitamin D deficiency, unspecified: Secondary | ICD-10-CM | POA: Diagnosis not present

## 2017-02-04 DIAGNOSIS — Z23 Encounter for immunization: Secondary | ICD-10-CM | POA: Diagnosis not present

## 2017-02-10 DIAGNOSIS — H25813 Combined forms of age-related cataract, bilateral: Secondary | ICD-10-CM | POA: Diagnosis not present

## 2017-02-10 DIAGNOSIS — H43819 Vitreous degeneration, unspecified eye: Secondary | ICD-10-CM | POA: Diagnosis not present

## 2017-02-10 DIAGNOSIS — H35033 Hypertensive retinopathy, bilateral: Secondary | ICD-10-CM | POA: Diagnosis not present

## 2017-02-10 DIAGNOSIS — H40013 Open angle with borderline findings, low risk, bilateral: Secondary | ICD-10-CM | POA: Diagnosis not present

## 2017-02-17 DIAGNOSIS — J329 Chronic sinusitis, unspecified: Secondary | ICD-10-CM | POA: Diagnosis not present

## 2017-02-17 DIAGNOSIS — J309 Allergic rhinitis, unspecified: Secondary | ICD-10-CM | POA: Diagnosis not present

## 2017-03-10 DIAGNOSIS — J329 Chronic sinusitis, unspecified: Secondary | ICD-10-CM | POA: Diagnosis not present

## 2017-04-09 DIAGNOSIS — J209 Acute bronchitis, unspecified: Secondary | ICD-10-CM | POA: Diagnosis not present

## 2017-07-28 DIAGNOSIS — N3000 Acute cystitis without hematuria: Secondary | ICD-10-CM | POA: Diagnosis not present

## 2017-07-28 DIAGNOSIS — N201 Calculus of ureter: Secondary | ICD-10-CM | POA: Diagnosis not present

## 2017-07-28 DIAGNOSIS — N318 Other neuromuscular dysfunction of bladder: Secondary | ICD-10-CM | POA: Diagnosis not present

## 2017-07-28 DIAGNOSIS — I714 Abdominal aortic aneurysm, without rupture: Secondary | ICD-10-CM | POA: Diagnosis not present

## 2017-07-30 DIAGNOSIS — K5730 Diverticulosis of large intestine without perforation or abscess without bleeding: Secondary | ICD-10-CM | POA: Diagnosis not present

## 2017-07-30 DIAGNOSIS — R918 Other nonspecific abnormal finding of lung field: Secondary | ICD-10-CM | POA: Diagnosis not present

## 2017-07-30 DIAGNOSIS — M5136 Other intervertebral disc degeneration, lumbar region: Secondary | ICD-10-CM | POA: Diagnosis not present

## 2017-07-30 DIAGNOSIS — N201 Calculus of ureter: Secondary | ICD-10-CM | POA: Diagnosis not present

## 2017-07-30 DIAGNOSIS — M545 Low back pain: Secondary | ICD-10-CM | POA: Diagnosis not present

## 2017-07-30 DIAGNOSIS — I714 Abdominal aortic aneurysm, without rupture: Secondary | ICD-10-CM | POA: Diagnosis not present

## 2017-07-30 DIAGNOSIS — I700 Atherosclerosis of aorta: Secondary | ICD-10-CM | POA: Diagnosis not present

## 2017-07-30 DIAGNOSIS — M47816 Spondylosis without myelopathy or radiculopathy, lumbar region: Secondary | ICD-10-CM | POA: Diagnosis not present

## 2017-08-01 DIAGNOSIS — N200 Calculus of kidney: Secondary | ICD-10-CM | POA: Diagnosis not present

## 2017-08-01 DIAGNOSIS — N3020 Other chronic cystitis without hematuria: Secondary | ICD-10-CM | POA: Diagnosis not present

## 2018-01-05 DIAGNOSIS — K5790 Diverticulosis of intestine, part unspecified, without perforation or abscess without bleeding: Secondary | ICD-10-CM | POA: Diagnosis not present

## 2018-01-05 DIAGNOSIS — C44519 Basal cell carcinoma of skin of other part of trunk: Secondary | ICD-10-CM | POA: Diagnosis not present

## 2018-01-19 DIAGNOSIS — C44519 Basal cell carcinoma of skin of other part of trunk: Secondary | ICD-10-CM | POA: Diagnosis not present

## 2018-01-20 DIAGNOSIS — Z23 Encounter for immunization: Secondary | ICD-10-CM | POA: Diagnosis not present

## 2018-02-05 DIAGNOSIS — E559 Vitamin D deficiency, unspecified: Secondary | ICD-10-CM | POA: Diagnosis not present

## 2018-02-05 DIAGNOSIS — E663 Overweight: Secondary | ICD-10-CM | POA: Diagnosis not present

## 2018-02-05 DIAGNOSIS — M8580 Other specified disorders of bone density and structure, unspecified site: Secondary | ICD-10-CM | POA: Diagnosis not present

## 2018-02-05 DIAGNOSIS — Z6826 Body mass index (BMI) 26.0-26.9, adult: Secondary | ICD-10-CM | POA: Diagnosis not present

## 2018-02-05 DIAGNOSIS — Z0000 Encounter for general adult medical examination without abnormal findings: Secondary | ICD-10-CM | POA: Diagnosis not present

## 2018-02-05 DIAGNOSIS — Z1389 Encounter for screening for other disorder: Secondary | ICD-10-CM | POA: Diagnosis not present

## 2018-02-05 DIAGNOSIS — M810 Age-related osteoporosis without current pathological fracture: Secondary | ICD-10-CM | POA: Diagnosis not present

## 2018-02-25 DIAGNOSIS — Z0100 Encounter for examination of eyes and vision without abnormal findings: Secondary | ICD-10-CM | POA: Diagnosis not present

## 2018-02-25 DIAGNOSIS — H43819 Vitreous degeneration, unspecified eye: Secondary | ICD-10-CM | POA: Diagnosis not present

## 2018-02-25 DIAGNOSIS — H35033 Hypertensive retinopathy, bilateral: Secondary | ICD-10-CM | POA: Diagnosis not present

## 2018-02-25 DIAGNOSIS — H40013 Open angle with borderline findings, low risk, bilateral: Secondary | ICD-10-CM | POA: Diagnosis not present

## 2018-02-25 DIAGNOSIS — H25813 Combined forms of age-related cataract, bilateral: Secondary | ICD-10-CM | POA: Diagnosis not present

## 2018-05-08 DIAGNOSIS — J329 Chronic sinusitis, unspecified: Secondary | ICD-10-CM | POA: Diagnosis not present

## 2018-05-08 DIAGNOSIS — R05 Cough: Secondary | ICD-10-CM | POA: Diagnosis not present

## 2018-05-18 DIAGNOSIS — R05 Cough: Secondary | ICD-10-CM | POA: Diagnosis not present

## 2018-05-18 DIAGNOSIS — J329 Chronic sinusitis, unspecified: Secondary | ICD-10-CM | POA: Diagnosis not present

## 2018-06-01 DIAGNOSIS — R630 Anorexia: Secondary | ICD-10-CM | POA: Diagnosis not present

## 2018-06-01 DIAGNOSIS — K5790 Diverticulosis of intestine, part unspecified, without perforation or abscess without bleeding: Secondary | ICD-10-CM | POA: Diagnosis not present

## 2018-06-01 DIAGNOSIS — R197 Diarrhea, unspecified: Secondary | ICD-10-CM | POA: Diagnosis not present

## 2018-06-01 DIAGNOSIS — R5383 Other fatigue: Secondary | ICD-10-CM | POA: Diagnosis not present

## 2018-06-01 DIAGNOSIS — R109 Unspecified abdominal pain: Secondary | ICD-10-CM | POA: Diagnosis not present

## 2018-06-01 DIAGNOSIS — R0981 Nasal congestion: Secondary | ICD-10-CM | POA: Diagnosis not present

## 2018-10-12 DIAGNOSIS — M4726 Other spondylosis with radiculopathy, lumbar region: Secondary | ICD-10-CM | POA: Diagnosis not present

## 2018-10-12 DIAGNOSIS — M6281 Muscle weakness (generalized): Secondary | ICD-10-CM | POA: Diagnosis not present

## 2018-10-12 DIAGNOSIS — M545 Low back pain: Secondary | ICD-10-CM | POA: Diagnosis not present

## 2018-10-15 DIAGNOSIS — M4726 Other spondylosis with radiculopathy, lumbar region: Secondary | ICD-10-CM | POA: Diagnosis not present

## 2018-10-15 DIAGNOSIS — M6281 Muscle weakness (generalized): Secondary | ICD-10-CM | POA: Diagnosis not present

## 2018-10-15 DIAGNOSIS — M545 Low back pain: Secondary | ICD-10-CM | POA: Diagnosis not present

## 2018-10-19 DIAGNOSIS — M4726 Other spondylosis with radiculopathy, lumbar region: Secondary | ICD-10-CM | POA: Diagnosis not present

## 2018-10-19 DIAGNOSIS — M545 Low back pain: Secondary | ICD-10-CM | POA: Diagnosis not present

## 2018-10-19 DIAGNOSIS — M6281 Muscle weakness (generalized): Secondary | ICD-10-CM | POA: Diagnosis not present

## 2018-10-22 DIAGNOSIS — M4726 Other spondylosis with radiculopathy, lumbar region: Secondary | ICD-10-CM | POA: Diagnosis not present

## 2018-10-22 DIAGNOSIS — M545 Low back pain: Secondary | ICD-10-CM | POA: Diagnosis not present

## 2018-10-22 DIAGNOSIS — M6281 Muscle weakness (generalized): Secondary | ICD-10-CM | POA: Diagnosis not present

## 2018-10-26 DIAGNOSIS — M545 Low back pain: Secondary | ICD-10-CM | POA: Diagnosis not present

## 2018-10-26 DIAGNOSIS — M6281 Muscle weakness (generalized): Secondary | ICD-10-CM | POA: Diagnosis not present

## 2018-10-26 DIAGNOSIS — M4726 Other spondylosis with radiculopathy, lumbar region: Secondary | ICD-10-CM | POA: Diagnosis not present

## 2018-11-03 DIAGNOSIS — M545 Low back pain: Secondary | ICD-10-CM | POA: Diagnosis not present

## 2018-11-03 DIAGNOSIS — I10 Essential (primary) hypertension: Secondary | ICD-10-CM | POA: Diagnosis not present

## 2018-11-05 DIAGNOSIS — M545 Low back pain: Secondary | ICD-10-CM | POA: Diagnosis not present

## 2018-11-05 DIAGNOSIS — I10 Essential (primary) hypertension: Secondary | ICD-10-CM | POA: Diagnosis not present

## 2018-11-16 DIAGNOSIS — F321 Major depressive disorder, single episode, moderate: Secondary | ICD-10-CM | POA: Diagnosis not present

## 2018-11-16 DIAGNOSIS — I10 Essential (primary) hypertension: Secondary | ICD-10-CM | POA: Diagnosis not present

## 2018-11-16 DIAGNOSIS — F419 Anxiety disorder, unspecified: Secondary | ICD-10-CM | POA: Diagnosis not present

## 2018-11-23 DIAGNOSIS — F322 Major depressive disorder, single episode, severe without psychotic features: Secondary | ICD-10-CM | POA: Diagnosis not present

## 2018-11-23 DIAGNOSIS — R030 Elevated blood-pressure reading, without diagnosis of hypertension: Secondary | ICD-10-CM | POA: Diagnosis not present

## 2018-11-23 DIAGNOSIS — F411 Generalized anxiety disorder: Secondary | ICD-10-CM | POA: Diagnosis not present

## 2018-12-10 DIAGNOSIS — I4892 Unspecified atrial flutter: Secondary | ICD-10-CM | POA: Diagnosis not present

## 2018-12-10 DIAGNOSIS — R0600 Dyspnea, unspecified: Secondary | ICD-10-CM | POA: Diagnosis not present

## 2018-12-10 DIAGNOSIS — I361 Nonrheumatic tricuspid (valve) insufficiency: Secondary | ICD-10-CM | POA: Diagnosis not present

## 2018-12-10 DIAGNOSIS — I5030 Unspecified diastolic (congestive) heart failure: Secondary | ICD-10-CM | POA: Diagnosis not present

## 2018-12-10 DIAGNOSIS — M1990 Unspecified osteoarthritis, unspecified site: Secondary | ICD-10-CM | POA: Diagnosis not present

## 2018-12-10 DIAGNOSIS — J90 Pleural effusion, not elsewhere classified: Secondary | ICD-10-CM | POA: Diagnosis not present

## 2018-12-10 DIAGNOSIS — R7989 Other specified abnormal findings of blood chemistry: Secondary | ICD-10-CM | POA: Diagnosis not present

## 2018-12-10 DIAGNOSIS — R000 Tachycardia, unspecified: Secondary | ICD-10-CM | POA: Diagnosis not present

## 2018-12-10 DIAGNOSIS — I517 Cardiomegaly: Secondary | ICD-10-CM | POA: Diagnosis not present

## 2018-12-10 DIAGNOSIS — R0989 Other specified symptoms and signs involving the circulatory and respiratory systems: Secondary | ICD-10-CM | POA: Diagnosis not present

## 2018-12-10 DIAGNOSIS — F419 Anxiety disorder, unspecified: Secondary | ICD-10-CM | POA: Diagnosis not present

## 2018-12-10 DIAGNOSIS — I248 Other forms of acute ischemic heart disease: Secondary | ICD-10-CM | POA: Diagnosis not present

## 2018-12-10 DIAGNOSIS — Z8719 Personal history of other diseases of the digestive system: Secondary | ICD-10-CM | POA: Diagnosis not present

## 2018-12-10 DIAGNOSIS — I340 Nonrheumatic mitral (valve) insufficiency: Secondary | ICD-10-CM | POA: Diagnosis not present

## 2018-12-10 DIAGNOSIS — R0602 Shortness of breath: Secondary | ICD-10-CM | POA: Diagnosis not present

## 2018-12-10 DIAGNOSIS — I371 Nonrheumatic pulmonary valve insufficiency: Secondary | ICD-10-CM | POA: Diagnosis not present

## 2018-12-10 DIAGNOSIS — Z79899 Other long term (current) drug therapy: Secondary | ICD-10-CM | POA: Diagnosis not present

## 2018-12-10 DIAGNOSIS — I509 Heart failure, unspecified: Secondary | ICD-10-CM | POA: Diagnosis not present

## 2018-12-10 DIAGNOSIS — I5033 Acute on chronic diastolic (congestive) heart failure: Secondary | ICD-10-CM | POA: Diagnosis not present

## 2018-12-10 DIAGNOSIS — Z87891 Personal history of nicotine dependence: Secondary | ICD-10-CM | POA: Diagnosis not present

## 2018-12-10 DIAGNOSIS — I484 Atypical atrial flutter: Secondary | ICD-10-CM | POA: Diagnosis not present

## 2018-12-11 DIAGNOSIS — Z87891 Personal history of nicotine dependence: Secondary | ICD-10-CM

## 2018-12-11 DIAGNOSIS — I340 Nonrheumatic mitral (valve) insufficiency: Secondary | ICD-10-CM

## 2018-12-11 DIAGNOSIS — R0600 Dyspnea, unspecified: Secondary | ICD-10-CM

## 2018-12-11 DIAGNOSIS — I4892 Unspecified atrial flutter: Secondary | ICD-10-CM

## 2018-12-12 DIAGNOSIS — R0600 Dyspnea, unspecified: Secondary | ICD-10-CM

## 2018-12-12 DIAGNOSIS — I5030 Unspecified diastolic (congestive) heart failure: Secondary | ICD-10-CM

## 2018-12-12 DIAGNOSIS — I4892 Unspecified atrial flutter: Secondary | ICD-10-CM

## 2018-12-14 NOTE — Progress Notes (Signed)
This encounter was created in error - please disregard.

## 2018-12-15 ENCOUNTER — Other Ambulatory Visit: Payer: Self-pay | Admitting: *Deleted

## 2018-12-15 NOTE — Patient Outreach (Signed)
Transition of care referral for [employer] pt, Ms. Marcia Leblanc. Pt did not answer the phone and I left a message to return my call.  I will call again later today.  [name] returned my call:  Consent for intake given and transition of care template completed.  Primary care: Emogene Morgan, PA , under [facility] Roger (this is pt   F/u on Friday with cardiology.  Husband is caregiver, Barnabas Lister. He is very helpful and healthy himself.  Has Living Will and HCPOA  Patient was recently discharged from hospital and all medications have been reviewed.  Pt never took meds before this hospitalization. Meds: Eliquis, Lasix, Potassium and Diltiazem. Unsure of doses. She is to report that next  week.  CP to include AFIB and med education.    Eulah Pont. Myrtie Neither, MSN, [facility] Gerontological Nurse Practitioner [facility] Care Management [phone]

## 2018-12-16 ENCOUNTER — Encounter: Payer: Self-pay | Admitting: *Deleted

## 2018-12-16 NOTE — Patient Outreach (Signed)
THN CM Care Plan Problem One     Most Recent Value  Care Plan Problem One  New diagnosis AFIB  Role Documenting the Problem One  Care Management Coordinator  Care Plan for Problem One  Active  THN Long Term Goal   Pt will be able to tell NP about her health problem (AFIB) and the medications she takes and what they do.  THN Long Term Goal Start Date  12/16/18  Interventions for Problem One Long Term Goal  Discussed her hospitalization and that she did not have all the facts yet. She will see cardiologist this week.  THN CM Short Term Goal #1   Pt will be able to describe her heart problem in the next 30 days.  THN CM Short Term Goal #1 Start Date  12/16/18  Interventions for Short Term Goal #1  Discussed liklihood that she has AFIB from her description of events in the hospital and the meds she was prescribed.  THN CM Short Term Goal #2   Pt will be able to tell me her meds and what they do for her at the end of 30 days.  THN CM Short Term Goal #2 Start Date  12/16/18    [facility] CM Care Plan Problem Two     Most Recent Value  Care Plan Problem Two  Pt has been sedentary.  Role Documenting the Problem Two  Care Management Coordinator  Care Plan for Problem Two  Active  THN CM Short Term Goal #1   Pt will walk with husband for 15 minutes, 5 days a week, weather permitting per her report at the end of 30 days.  THN CM Short Term Goal #1 Start Date  12/16/18     Eulah Pont. Myrtie Neither, MSN, [facility] Gerontological Nurse Practitioner [facility] Care Management [phone]

## 2018-12-18 ENCOUNTER — Other Ambulatory Visit: Payer: Self-pay

## 2018-12-18 ENCOUNTER — Encounter: Payer: Self-pay | Admitting: Cardiology

## 2018-12-18 ENCOUNTER — Ambulatory Visit (INDEPENDENT_AMBULATORY_CARE_PROVIDER_SITE_OTHER): Payer: Medicare HMO | Admitting: Cardiology

## 2018-12-18 VITALS — BP 128/74 | HR 69 | Ht 64.0 in | Wt 142.4 lb

## 2018-12-18 DIAGNOSIS — I4892 Unspecified atrial flutter: Secondary | ICD-10-CM | POA: Insufficient documentation

## 2018-12-18 DIAGNOSIS — I484 Atypical atrial flutter: Secondary | ICD-10-CM | POA: Diagnosis not present

## 2018-12-18 DIAGNOSIS — I340 Nonrheumatic mitral (valve) insufficiency: Secondary | ICD-10-CM | POA: Diagnosis not present

## 2018-12-18 DIAGNOSIS — I509 Heart failure, unspecified: Secondary | ICD-10-CM

## 2018-12-18 DIAGNOSIS — Z7901 Long term (current) use of anticoagulants: Secondary | ICD-10-CM

## 2018-12-18 HISTORY — DX: Long term (current) use of anticoagulants: Z79.01

## 2018-12-18 LAB — BASIC METABOLIC PANEL
BUN/Creatinine Ratio: 10 — ABNORMAL LOW (ref 12–28)
BUN: 9 mg/dL (ref 8–27)
CO2: 22 mmol/L (ref 20–29)
Calcium: 9.5 mg/dL (ref 8.7–10.3)
Chloride: 101 mmol/L (ref 96–106)
Creatinine, Ser: 0.93 mg/dL (ref 0.57–1.00)
GFR calc Af Amer: 69 mL/min/{1.73_m2} (ref >59)
GFR calc non Af Amer: 60 mL/min/{1.73_m2} (ref >59)
Glucose: 90 mg/dL (ref 65–99)
Potassium: 4.2 mmol/L (ref 3.5–5.2)
Sodium: 140 mmol/L (ref 134–144)

## 2018-12-18 LAB — PRO B NATRIURETIC PEPTIDE: NT-Pro BNP: 1135 pg/mL — ABNORMAL HIGH (ref 0–738)

## 2018-12-18 NOTE — Progress Notes (Signed)
Cardiology Office Note:    Date:  12/18/2018   ID:  Marcia Leblanc, DOB Apr 19, 1941, MRN [id number]  PCP:  Townsend Roger, MD  Cardiologist:  Shirlee More, MD    Referring MD: Allyne Gee, MD    ASSESSMENT:    1. Heart failure, unspecified HF chronicity, unspecified heart failure type ([place])   2. Atypical atrial flutter (HCC)   3. Chronic anticoagulation   4. Nonrheumatic mitral valve regurgitation    PLAN:    In order of problems listed above:  1. Improved compensated continue her low-dose diuretic recheck renal function proBNP today and plan echocardiogram once cardioverted back to sinus rhythm and if mitral regurgitation significant will need further evaluation especially with the potential for mitral clip 2. Stable the rate is controlled continue calcium channel blocker.  I encouraged her to purchase the iPhone adapter and check strips at home to be sure we have good heart rate control 3. Continue her current anticoagulant uninterrupted if her planning cardioversion 4. Severe in the setting of rapid heart rhythm decompensated heart failure need reassessment when she resumes sinus rhythm with compensated heart failure for significance.  This is secondary and functional MR   Next appointment: 2 weeks from today   Medication Adjustments/Labs and Tests Ordered: Current medicines are reviewed at length with the patient today.  Concerns regarding medicines are outlined above.  Orders Placed This Encounter  Procedures  . Basic Metabolic Panel (BMET)  . Pro b natriuretic peptide (BNP)  . EKG 12-Lead   No orders of the defined types were placed in this encounter.   Chief Complaint  Patient presents with  . Follow-up  . Atrial Flutter  . Congestive Heart Failure    History of Present Illness:    Marcia Leblanc is a 78 y.o. female with a hx of persistent atrial flutter with rapid rate for up to a month decompensated heart failure and severe mitral regurgitation last seen  last weekend at discharge from [facility] [address]  She responded to low-dose diuretic was anticoagulated rate control was achieved and plan was for discharge and outpatient cardioversion in 21 days.  Once in sinus rhythm and compensated heart failure would reassess the severity of her mitral regurgitation. Compliance with diet, lifestyle and medications: Yes  She quickly has recovered to normal she is walking every day exercising and has had no edema orthopnea shortness of breath chest pain palpitation or syncope.  She tolerates her anticoagulant without bleeding complication.  She been anticoagulated for 1 week we will see her in 2 weeks in the office here and set her up for cardioversion.  With diuretic therapy will check renal function with heart failure recheck proBNP. Past Medical History:  Diagnosis Date  . Arrhythmia   . Arthritis   . Atrial flutter ([place])   . CHF (congestive heart failure) ([place])   . Diastolic heart failure ([place])   . Dyspnea on exertion   . Mitral regurgitation     Past Surgical History:  Procedure Laterality Date  . CARPAL TUNNEL RELEASE Bilateral 68  . JOINT REPLACEMENT Right 2012   hip  . KNEE ARTHROSCOPY Right    torn cartilage  . TOTAL HIP ARTHROPLASTY Left 09/23/2014   Procedure: TOTAL HIP ARTHROPLASTY ANTERIOR APPROACH;  Surgeon: Marybelle Killings, MD;  Location: [facility];  Service: Orthopedics;  Laterality: Left;  . TUBAL LIGATION  68    Current Medications: Current Meds  Medication Sig  . diltiazem (CARDIZEM) 30 MG tablet TAKE 2 TABLETS  EVERY 8 HOURS  . ELIQUIS 5 MG TABS tablet Take 5 mg by mouth 2 (two) times daily.  . furosemide (LASIX) 20 MG tablet Take 20 mg by mouth daily.  . Multiple Vitamin (MULTIVITAMIN) tablet Take 1 tablet by mouth daily.  . potassium chloride (MICRO-K) 10 MEQ CR capsule Take 10 mEq by mouth daily.  Marland Kitchen Propylene Glycol (SYSTANE BALANCE OP) Apply 1 drop to eye as needed.     Allergies:   Patient has no known allergies.   Social  History   Socioeconomic History  . Marital status: Married    Spouse name: Not on file  . Number of children: Not on file  . Years of education: Not on file  . Highest education level: Not on file  Occupational History  . Not on file  Social Needs  . Financial resource strain: Not on file  . Food insecurity    Worry: Not on file    Inability: Not on file  . Transportation needs    Medical: Not on file    Non-medical: Not on file  Tobacco Use  . Smoking status: Former Smoker    Packs/day: 0.50    Years: 50.00    Pack years: 25.00    Types: Cigarettes    Quit date: 09/11/2012    Years since quitting: 6.2  . Smokeless tobacco: Never Used  Substance and Sexual Activity  . Alcohol use: Not Currently  . Drug use: No  . Sexual activity: Not on file  Lifestyle  . Physical activity    Days per week: Not on file    Minutes per session: Not on file  . Stress: Not on file  Relationships  . Social Herbalist on phone: Not on file    Gets together: Not on file    Attends religious service: Not on file    Active member of club or organization: Not on file    Attends meetings of clubs or organizations: Not on file    Relationship status: Not on file  Other Topics Concern  . Not on file  Social History Narrative  . Not on file     Family History: The patient's family history is not on file. ROS:   Please see the history of present illness.    All other systems reviewed and are negative.  EKGs/Labs/Other Studies Reviewed:    The following studies were reviewed today:  EKG:  EKG ordered today and personally reviewed.  The ekg ordered today demonstrates atrial flutter controlled rate  Recent Labs: No results found for requested labs within last 8760 hours.  Recent Lipid Panel No results found for: CHOL, TRIG, HDL, CHOLHDL, VLDL, LDLCALC, LDLDIRECT  Physical Exam:    VS:  BP 128/74 (BP Location: Left Arm, Patient Position: Sitting, Cuff Size: Normal)   Pulse  69   Ht 5\' 4"  (1.626 m)   Wt 142 lb 6.4 oz (64.6 kg)   SpO2 97%   BMI 24.44 kg/m     Wt Readings from Last 3 Encounters:  12/18/18 142 lb 6.4 oz (64.6 kg)  09/23/14 137 lb 14.4 oz (62.6 kg)  09/12/14 137 lb 14.4 oz (62.6 kg)     GEN:  Well nourished, well developed in no acute distress HEENT: Normal NECK: No JVD; No carotid bruits LYMPHATICS: No lymphadenopathy CARDIAC: Irregular rhythm variable first heart sound grade 1/6 to 2/6 MR at the apex it is not an impressive heart murmur for severe mitral regurgitation RESPIRATORY:  Clear to auscultation without rales, wheezing or rhonchi  ABDOMEN: Soft, non-tender, non-distended MUSCULOSKELETAL:  No edema; No deformity  SKIN: Warm and dry NEUROLOGIC:  Alert and oriented x 3 PSYCHIATRIC:  Normal affect    Signed, Shirlee More, MD  12/18/2018 11:18 AM    [place]

## 2018-12-18 NOTE — Patient Instructions (Signed)
Medication Instructions:  Your physician recommends that you continue on your current medications as directed. Please refer to the Current Medication list given to you today.  If you need a refill on your cardiac medications before your next appointment, please call your pharmacy.   Lab work: Your physician recommends that you return for lab work today: BMP, ProBNP.   If you have labs (blood work) drawn today and your tests are completely normal, you will receive your results only by: Marland Kitchen MyChart Message (if you have MyChart) OR . A paper copy in the mail If you have any lab test that is abnormal or we need to change your treatment, we will call you to review the results.  Testing/Procedures: You had an EKG today.   Follow-Up: At [facility], you and your health needs are our priority.  As part of our continuing mission to provide you with exceptional heart care, we have created designated Provider Care Teams.  These Care Teams include your primary Cardiologist (physician) and Advanced Practice Providers (APPs -  Physician Assistants and Nurse Practitioners) who all work together to provide you with the care you need, when you need it. You will need a follow up appointment in 2 weeks.

## 2018-12-21 DIAGNOSIS — I4892 Unspecified atrial flutter: Secondary | ICD-10-CM | POA: Diagnosis not present

## 2018-12-21 DIAGNOSIS — R195 Other fecal abnormalities: Secondary | ICD-10-CM | POA: Diagnosis not present

## 2018-12-22 ENCOUNTER — Other Ambulatory Visit: Payer: Self-pay | Admitting: *Deleted

## 2018-12-22 NOTE — Patient Outreach (Signed)
Telephone assessment to follow up on pt's cardiology visit.  No answer, left a voice mail and requested a return call.  Eulah Pont. Myrtie Neither, MSN, [facility] Gerontological Nurse Practitioner [facility] Care Management [phone]

## 2018-12-23 ENCOUNTER — Other Ambulatory Visit: Payer: Self-pay | Admitting: *Deleted

## 2018-12-23 ENCOUNTER — Other Ambulatory Visit: Payer: Self-pay

## 2018-12-24 ENCOUNTER — Encounter: Payer: Self-pay | Admitting: *Deleted

## 2018-12-24 ENCOUNTER — Other Ambulatory Visit: Payer: Self-pay | Admitting: *Deleted

## 2018-12-24 NOTE — Patient Outreach (Signed)
Telephone assessment:  Marcia Leblanc has seen her cardiologist and has been educated about her new diagnoses: Atypical atrial flutter, HF, mitral valve regurgitation.  She is more familiar with her meds and is learning the rationale for taking them. She is following the regimen religiously.  She is getting daily exercise.  She is monitoring her wt and BP.  We completed the initial evaluation today.  Fall Risk  12/24/2018  Falls in the past year? 0  Number falls in past yr: 0  Injury with Fall? 0  Risk for fall due to : Medication side effect  Follow up Falls evaluation completed;Falls prevention discussed   Depression screen [facility] 2/9 12/24/2018  Decreased Interest 0  Down, Depressed, Hopeless 0  PHQ - 2 Score 0   THN CM Care Plan Problem One     Most Recent Value  Care Plan Problem One  New diagnosis Atrial flutter, CHF, Mitral valve regurgitation.  Role Documenting the Problem One  Care Management Coordinator  Care Plan for Problem One  Active  THN Long Term Goal   Pt will be able to tell NP about her health problem (Atrial flutter, CHF and mitral valve regurgitation) and the medications she takes and what they do over the next 90 days.  THN Long Term Goal Start Date  12/16/18  Interventions for Problem One Long Term Goal  Discussed her new dx what the physiological process is, self mgmt, meds and rationale for taking them.  THN CM Short Term Goal #1   Pt will be able to describe her heart problem in the next 30 days.  THN CM Short Term Goal #1 Start Date  12/16/18  Interventions for Short Term Goal #1  Discussed conditions and assessed what pt knows about heart rhythm and CHF.  THN CM Short Term Goal #2   Pt will be able to tell me her meds and what they do for her at the end of 30 days.  THN CM Short Term Goal #2 Start Date  12/16/18  Interventions for Short Term Goal #2  Reviewed meds and pt acknowledges basic understanding.  THN CM Short Term Goal #3  Pt will learn about HF action  plan and be able to tell me zones and what to do at the end of 30 days.  Interventions for Short Tern Goal #3  Discussed when to call MD or NP: increased wt, SOB and edema.  THN CM Short Term Goal #4  Pt will begin weighing and recording her wt daily and report to NP values over the next 30 days.  THN CM Short Term Goal #4 Met Date  12/24/18  Interventions for Short Term Goal #4  Pt has been weighing daily since she was discharged from the hospital and keeping records.    THN CM Care Plan Problem Two     Most Recent Value  Care Plan Problem Two  Pt has been sedentary.  Role Documenting the Problem Two  Care Management Coordinator  Care Plan for Problem Two  Active  THN CM Short Term Goal #1   Pt will walk with husband for 15 minutes, 5 days a week, weather permitting per her report at the end of 30 days.  THN CM Short Term Goal #1 Start Date  12/16/18  [facility] CM Short Term Goal #1 Met Date   12/24/18    [facility] CM Care Plan Problem Three     Most Recent Value  Care Plan Problem Three  Needs HCPOA and  MOST form.  Role Documenting the Problem Three  Care Management Coordinator  THN CM Short Term Goal #1   Pt will complete at least the HCPOA by the end of 30 days.  THN CM Short Term Goal #1 Start Date  12/24/18  Interventions for Short Term Goal #1  Encouraged additional advanced directives besides her living will.     Eulah Pont. Myrtie Neither, MSN, [facility] Gerontological Nurse Practitioner [facility] Care Management [phone]

## 2019-01-01 ENCOUNTER — Other Ambulatory Visit: Payer: Self-pay

## 2019-01-01 ENCOUNTER — Other Ambulatory Visit: Payer: Self-pay | Admitting: *Deleted

## 2019-01-01 NOTE — Patient Outreach (Signed)
Telephone outreach unanswered (call made 2 1/2 hours past scheduled time), left message requesting a return call.  Marcia Leblanc. Myrtie Neither, MSN, [facility] Gerontological Nurse Practitioner [facility] Care Management [phone]

## 2019-01-05 ENCOUNTER — Other Ambulatory Visit: Payer: Self-pay | Admitting: *Deleted

## 2019-01-05 NOTE — Patient Outreach (Signed)
Outreach for weekly telephone check in for care management.  Unable to talk with pt, but able to leave a message and requested a return call.  Marcia Leblanc. Myrtie Neither, MSN, [facility] Gerontological Nurse Practitioner [facility] Care Management [phone]

## 2019-01-06 ENCOUNTER — Encounter: Payer: Self-pay | Admitting: *Deleted

## 2019-01-06 NOTE — Patient Outreach (Signed)
Mrs. Biber returned my call yesterday 01/05/19. We discussed alternative ways of communication that would be more desireable for her. We agreed that email may be a more practical way for her to communicate with me. I will be emailing her next week to follow up.  Mrs. [place] is doing an exceptional job with managing her new health issues. She is weighing daily, taking her meds on schedule, exercising 45 minutes a day with her husband, usually walking. She does not use salt. She is using an iphone app to monitor her EKG which is also reported to her MD. She is recording everything in a notebook.  We discussed the HF ACTION PLAN in detail today:  Everyday tasks for HF  Green zone - control no sxs.  Yellow zone - call MD, to avoid red zone, prevent                        complications.  Red zone - avoid   I will send her the page with visual guidelines.  Encouraged her to call me with any problems and I will email her next week to check in.  Eulah Pont. Myrtie Neither, MSN, [facility] Gerontological Nurse Practitioner [facility] Care Management [phone]

## 2019-01-07 ENCOUNTER — Ambulatory Visit (INDEPENDENT_AMBULATORY_CARE_PROVIDER_SITE_OTHER): Payer: Medicare HMO | Admitting: Cardiology

## 2019-01-07 ENCOUNTER — Other Ambulatory Visit: Payer: Self-pay

## 2019-01-07 ENCOUNTER — Encounter: Payer: Self-pay | Admitting: Cardiology

## 2019-01-07 VITALS — BP 160/78 | HR 64 | Ht 64.0 in | Wt 142.0 lb

## 2019-01-07 DIAGNOSIS — I340 Nonrheumatic mitral (valve) insufficiency: Secondary | ICD-10-CM | POA: Diagnosis not present

## 2019-01-07 DIAGNOSIS — I484 Atypical atrial flutter: Secondary | ICD-10-CM | POA: Diagnosis not present

## 2019-01-07 NOTE — Progress Notes (Addendum)
Cardiology Office Note:    Date:  01/07/2019   ID:  Marcia Leblanc, DOB Jun 24, 1940, MRN [id number]  PCP:  Townsend Roger, MD  Cardiologist:  No primary care provider on file.  Electrophysiologist:  None   Referring MD: Townsend Roger, MD  ASSESSMENT:    1. Atypical atrial flutter ([place])   2. Nonrheumatic mitral valve regurgitation    PLAN:    Upon review of her device, the patient in atypical atrial flutter with occasional bouts of atrial fibrillation- she rate controlled and she is on anticoagulation. I discuss with patient that it will be best to get her set up for a cardioversion. Also I would favor a TEE/DCCV at which time we can assess her mitral valve. For now she prefers to discuss this again with Dr. Bettina Gavia as well as her son ( who is a physician).  She inquired about holding her eliquis for a possible dentist procedure but I advised against that for now as we are hoping to do a DCCV and there should be no interuptions in her anticoagulation.  She will see Dr. Bettina Gavia in 2 weeks.    Chief complaint:  Follow up for Atrial flutter.  History of Present Illness:    Marcia Leblanc is a 78 y.o. female with a hx of atrial flutter on anticoagulation with Eliquis, diastolic heart failure not regurgitation presents today for follow-up. The  Patient follows with Dr. Bettina Gavia and saw him on 12/18/2018. During her visit she was encouraged to get a mobile device to help monitor her heart and rhythm. She has using her Kardiamobile device for the last 2 weeks to assess her heart rate and rhythm. She denies any chest pain, palpitations and associate shortness of breath with activities.   No other complaints.  Past Medical History:  Diagnosis Date  . Arrhythmia   . Arthritis   . Atrial flutter ([place])   . CHF (congestive heart failure) ([place])   . Diastolic heart failure ([place])   . Dyspnea on exertion   . Mitral regurgitation     Past Surgical History:  Procedure Laterality Date  . CARPAL  TUNNEL RELEASE Bilateral 68  . JOINT REPLACEMENT Right 2012   hip  . KNEE ARTHROSCOPY Right    torn cartilage  . TOTAL HIP ARTHROPLASTY Left 09/23/2014   Procedure: TOTAL HIP ARTHROPLASTY ANTERIOR APPROACH;  Surgeon: Marybelle Killings, MD;  Location: [facility];  Service: Orthopedics;  Laterality: Left;  . TUBAL LIGATION  68    Current Medications: Current Meds  Medication Sig  . cholecalciferol (VITAMIN D3) 25 MCG (1000 UT) tablet Take 1,000 Units by mouth daily.  Marland Kitchen diltiazem (CARDIZEM) 30 MG tablet TAKE 2 TABLETS EVERY 8 HOURS  . ELIQUIS 5 MG TABS tablet Take 5 mg by mouth 2 (two) times daily.  . furosemide (LASIX) 20 MG tablet Take 20 mg by mouth daily.  . Multiple Vitamin (MULTIVITAMIN) tablet Take 1 tablet by mouth daily.  . potassium chloride (MICRO-K) 10 MEQ CR capsule Take 10 mEq by mouth daily.  Marland Kitchen Propylene Glycol (SYSTANE BALANCE OP) Apply 1 drop to eye as needed.     Allergies:   Patient has no known allergies.   Social History   Socioeconomic History  . Marital status: Married    Spouse name: Barnabas Lister  . Number of children: 2  . Years of education: Not on file  . Highest education level: Not on file  Occupational History  . Not on file  Social Needs  .  Financial resource strain: Not hard at all  . Food insecurity    Worry: Never true    Inability: Never true  . Transportation needs    Medical: No    Non-medical: No  Tobacco Use  . Smoking status: Former Smoker    Packs/day: 0.50    Years: 50.00    Pack years: 25.00    Types: Cigarettes    Quit date: 09/11/2012    Years since quitting: 6.3  . Smokeless tobacco: Never Used  Substance and Sexual Activity  . Alcohol use: Not Currently  . Drug use: No  . Sexual activity: Yes  Lifestyle  . Physical activity    Days per week: Not on file    Minutes per session: Not on file  . Stress: Only a little  Relationships  . Social connections    Talks on phone: More than three times a week    Gets together: More than three  times a week    Attends religious service: More than 4 times per year    Active member of club or organization: Yes    Attends meetings of clubs or organizations: 1 to 4 times per year    Relationship status: Married  Other Topics Concern  . Not on file  Social History Narrative   Pt has some stress recently due to new medical dxs and learning how to self manage. She is coping well and following MD orders.     Family History: The patient's family history is negative for Heart attack and Stroke.  ROS:   Review of Systems  Constitution: Negative for decreased appetite, fever and weight gain.  HENT: Negative for congestion, ear discharge, hoarse voice and sore throat.   Eyes: Negative for discharge, redness, vision loss in right eye and visual halos.  Cardiovascular: Negative for chest pain, dyspnea on exertion, leg swelling, orthopnea and palpitations.  Respiratory: Negative for cough, hemoptysis, shortness of breath and snoring.   Endocrine: Negative for heat intolerance and polyphagia.  Hematologic/Lymphatic: Negative for bleeding problem. Does not bruise/bleed easily.  Skin: Negative for flushing, nail changes, rash and suspicious lesions.  Musculoskeletal: Negative for arthritis, joint pain, muscle cramps, myalgias, neck pain and stiffness.  Gastrointestinal: Negative for abdominal pain, bowel incontinence, diarrhea and excessive appetite.  Genitourinary: Negative for decreased libido, genital sores and incomplete emptying.  Neurological: Negative for brief paralysis, focal weakness, headaches and loss of balance.  Psychiatric/Behavioral: Negative for altered mental status, depression and suicidal ideas.  Allergic/Immunologic: Negative for HIV exposure and persistent infections.    EKGs/Labs/Other Studies Reviewed:    The following studies were reviewed today:   EKG:    Not performed today. Reviewed previous ekg 2 weeks ago with evidence of atrial flutter.  Also reviewed the  patient mobile device which showed that the patient in atrial flutter and intermittent bouts of atrial fibrillation.    Recent Labs: 12/18/2018: BUN 9; Creatinine, Ser 0.93; NT-Pro BNP 1,135; Potassium 4.2; Sodium 140  Recent Lipid Panel No results found for: CHOL, TRIG, HDL, CHOLHDL, VLDL, LDLCALC, LDLDIRECT  Physical Exam:    VS:  BP (!) 160/78 (BP Location: Right Arm, Patient Position: Sitting, Cuff Size: Normal)   Pulse 64   Ht 5\' 4"  (1.626 m)   Wt 142 lb (64.4 kg)   SpO2 97%   BMI 24.37 kg/m     Wt Readings from Last 3 Encounters:  01/07/19 142 lb (64.4 kg)  12/18/18 142 lb 6.4 oz (64.6 kg)  09/23/14 137  lb 14.4 oz (62.6 kg)     GEN:  Well nourished, well developed in no acute distress HEENT: Normal NECK: No JVD; No carotid bruits LYMPHATICS: No lymphadenopathy CARDIAC: RRR, II/VI holosystolic murmur best heard at the apex with no radiation, rubs, gallops RESPIRATORY:  Clear to auscultation without rales, wheezing or rhonchi  ABDOMEN: Soft, non-tender, non-distended EXTREMITIES: No cyanosis, no clubbing, no edema MUSCULOSKELETAL:  No edema; No deformity  SKIN: Warm and dry NEUROLOGIC:  Alert and oriented x 3, nonfocal PSYCHIATRIC:  Normal affect      Medication Adjustments/Labs and Tests Ordered: Current medicines are reviewed at length with the patient today.  Concerns regarding medicines are outlined above.  Orders Placed This Encounter  Procedures  . Basic Metabolic Panel (BMET)  . Magnesium   No orders of the defined types were placed in this encounter.   Patient Instructions  Medication Instructions:  Your physician recommends that you continue on your current medications as directed. Please refer to the Current Medication list given to you today.  If you need a refill on your cardiac medications before your next appointment, please call your pharmacy.   Lab work: Your physician recommends that you return for lab work in: 1 week before cardioversion  BMP,Magnesium  If you have labs (blood work) drawn today and your tests are completely normal, you will receive your results only by: Marland Kitchen MyChart Message (if you have MyChart) OR . A paper copy in the mail If you have any lab test that is abnormal or we need to change your treatment, we will call you to review the results.  Testing/Procedures: DISCUSS CARDIOVERSION WITH DR . Bettina Gavia AT NEXT APPOINTMENT  Follow-Up: At [facility], you and your health needs are our priority.  As part of our continuing mission to provide you with exceptional heart care, we have created designated Provider Care Teams.  These Care Teams include your primary Cardiologist (physician) and Advanced Practice Providers (APPs -  Physician Assistants and Nurse Practitioners) who all work together to provide you with the care you need, when you need it. You will need a follow up appointment in 2 weeks with Dr Bettina Gavia Any Other Special Instructions Will Be Listed Below (If Applicable).        Rolly Pancake, DO  01/07/2019 11:42 AM    [facility]

## 2019-01-07 NOTE — Patient Instructions (Signed)
Medication Instructions:  Your physician recommends that you continue on your current medications as directed. Please refer to the Current Medication list given to you today.  If you need a refill on your cardiac medications before your next appointment, please call your pharmacy.   Lab work: Your physician recommends that you return for lab work in: 1 week before cardioversion BMP,Magnesium  If you have labs (blood work) drawn today and your tests are completely normal, you will receive your results only by: Marland Kitchen MyChart Message (if you have MyChart) OR . A paper copy in the mail If you have any lab test that is abnormal or we need to change your treatment, we will call you to review the results.  Testing/Procedures: DISCUSS CARDIOVERSION WITH DR . Bettina Gavia AT NEXT APPOINTMENT  Follow-Up: At [facility], you and your health needs are our priority.  As part of our continuing mission to provide you with exceptional heart care, we have created designated Provider Care Teams.  These Care Teams include your primary Cardiologist (physician) and Advanced Practice Providers (APPs -  Physician Assistants and Nurse Practitioners) who all work together to provide you with the care you need, when you need it. You will need a follow up appointment in 2 weeks with Dr Bettina Gavia Any Other Special Instructions Will Be Listed Below (If Applicable).

## 2019-01-11 DIAGNOSIS — I4892 Unspecified atrial flutter: Secondary | ICD-10-CM | POA: Diagnosis not present

## 2019-01-19 ENCOUNTER — Other Ambulatory Visit: Payer: Self-pay | Admitting: *Deleted

## 2019-01-19 NOTE — Patient Outreach (Signed)
Sent email to pt per her request to follow up. Sent email last week, but I did not get a return message. Will send this today and ask if she will please call me so I can give her some pertinent information for her further care.  It appears she had a follow up with cardiology and they would like to pursue a cardioversion and an evaluation of her mitral valve. She was advised to postpone her dental work so she could move forward with these procedures.  I am transitioning Marcia Leblanc over to one of my co-workers who will be taking on all our [place] pts.  Please note that Dr. Nona Dell is Marcia Leblanc's son.  Eulah Pont. Myrtie Neither, MSN, GNP-BC Gerontological Nurse Practitioner [facility] Care Management [phone] .

## 2019-01-23 NOTE — Progress Notes (Signed)
Cardiology Office Note:    Date:  01/25/2019   ID:  Marcia Leblanc, DOB 06-02-1940, MRN [id number]  PCP:  Townsend Roger, MD  Cardiologist:  Shirlee More, MD    Referring MD: Townsend Roger, MD    ASSESSMENT:    1. Atypical atrial flutter ([place])   2. Chronic anticoagulation   3. Heart failure, unspecified HF chronicity, unspecified heart failure type ([place])   4. Nonrheumatic mitral valve regurgitation    PLAN:    In order of problems listed above:  1. Stable rate is controlled continue calcium channel blocker anticoagulant and she will take her Eliquis in the morning with a sip of water prior to cardioversion.  She asked me about transesophageal echocardiogram and I do not see it necessary she has been electively anticoagulated. 2. Compliant with and continue anticoagulant 3. Stable compensated continue her current diuretic 4. Stable plan to recheck echocardiogram after resuming sinus rhythm.   Next appointment: 3 to 4 weeks   Medication Adjustments/Labs and Tests Ordered: Current medicines are reviewed at length with the patient today.  Concerns regarding medicines are outlined above.  No orders of the defined types were placed in this encounter.  No orders of the defined types were placed in this encounter.   Chief Complaint  Patient presents with  . Follow-up    for atrial flutter pending cardioversion  . Congestive Heart Failure  . Mitral Regurgitation    History of Present Illness:    Marcia Leblanc is a 78 y.o. female with a hx of atrial flutter anticoagulation mitral regurgitation and heart failure last seen 2 weeks ago by Dr. Harriet Masson in anticipation of elective outpatient cardioversion. Compliance with diet, lifestyle and medications: Yes  Overall she is doing better she is short of breath when she walks quickly or does exercise but has made remarkable improvement has no orthopnea edema cough palpitation syncope and no bleeding complication of her anticoagulant.   She is compliant with her medications.  She is concerned about difficulty with her memory and I told her I think it is unrelated to her arrhythmia or medications.  We discussed the benefits options and risk of cardioversion she wants an attempt to resume sinus rhythm we will check labs today and plan to do it Friday morning [facility] at her request. Past Medical History:  Diagnosis Date  . Arrhythmia   . Arthritis   . Atrial flutter ([place])   . CHF (congestive heart failure) ([place])   . Diastolic heart failure ([place])   . Dyspnea on exertion   . Mitral regurgitation     Past Surgical History:  Procedure Laterality Date  . CARPAL TUNNEL RELEASE Bilateral 68  . JOINT REPLACEMENT Right 2012   hip  . KNEE ARTHROSCOPY Right    torn cartilage  . TOTAL HIP ARTHROPLASTY Left 09/23/2014   Procedure: TOTAL HIP ARTHROPLASTY ANTERIOR APPROACH;  Surgeon: Marybelle Killings, MD;  Location: [place];  Service: Orthopedics;  Laterality: Left;  . TUBAL LIGATION  68    Current Medications: Current Meds  Medication Sig  . cholecalciferol (VITAMIN D3) 25 MCG (1000 UT) tablet Take 1,000 Units by mouth daily.  Marland Kitchen diltiazem (CARDIZEM) 30 MG tablet TAKE 2 TABLETS EVERY 8 HOURS  . ELIQUIS 5 MG TABS tablet Take 5 mg by mouth 2 (two) times daily.  . furosemide (LASIX) 20 MG tablet Take 20 mg by mouth daily.  . Multiple Vitamin (MULTIVITAMIN) tablet Take 1 tablet by mouth daily.  . potassium  chloride (MICRO-K) 10 MEQ CR capsule Take 10 mEq by mouth daily.  Marland Kitchen Propylene Glycol (SYSTANE BALANCE OP) Apply 1 drop to eye as needed.     Allergies:   Patient has no known allergies.   Social History   Socioeconomic History  . Marital status: Married    Spouse name: Barnabas Lister  . Number of children: 2  . Years of education: Not on file  . Highest education level: Not on file  Occupational History  . Not on file  Social Needs  . Financial resource strain: Not hard at all  . Food insecurity    Worry: Never true     Inability: Never true  . Transportation needs    Medical: No    Non-medical: No  Tobacco Use  . Smoking status: Former Smoker    Packs/day: 0.50    Years: 50.00    Pack years: 25.00    Types: Cigarettes    Quit date: 09/11/2012    Years since quitting: 6.3  . Smokeless tobacco: Never Used  Substance and Sexual Activity  . Alcohol use: Not Currently  . Drug use: No  . Sexual activity: Yes  Lifestyle  . Physical activity    Days per week: Not on file    Minutes per session: Not on file  . Stress: Only a little  Relationships  . Social connections    Talks on phone: More than three times a week    Gets together: More than three times a week    Attends religious service: More than 4 times per year    Active member of club or organization: Yes    Attends meetings of clubs or organizations: 1 to 4 times per year    Relationship status: Married  Other Topics Concern  . Not on file  Social History Narrative   Pt has some stress recently due to new medical dxs and learning how to self manage. She is coping well and following MD orders.     Family History: The patient's family history is negative for Heart attack and Stroke. ROS:   Please see the history of present illness.    All other systems reviewed and are negative.  EKGs/Labs/Other Studies Reviewed:    The following studies were reviewed today:  EKG:  EKG ordered today and personally reviewed.  The ekg ordered today demonstrates atrial flutter controlled ventricular rate  Recent Labs: 12/18/2018: BUN 9; Creatinine, Ser 0.93; NT-Pro BNP 1,135; Potassium 4.2; Sodium 140  Recent Lipid Panel No results found for: CHOL, TRIG, HDL, CHOLHDL, VLDL, LDLCALC, LDLDIRECT  Physical Exam:    VS:  BP 120/72 (BP Location: Right Arm, Patient Position: Sitting, Cuff Size: Normal)   Pulse 86   Ht 5\' 4"  ([id number] m)   Wt 140 lb 9.6 oz (63.8 kg)   SpO2 98%   BMI 24.13 kg/m     Wt Readings from Last 3 Encounters:  01/25/19 140 lb 9.6  oz (63.8 kg)  01/07/19 142 lb (64.4 kg)  12/18/18 142 lb 6.4 oz (64.6 kg)     GEN:  Well nourished, well developed in no acute distress HEENT: Normal NECK: No JVD; No carotid bruits LYMPHATICS: No lymphadenopathy CARDIAC: Irregular rhythm S1 is variable 1 of 6 MR faint in the precordium  no  rubs, gallops RESPIRATORY:  Clear to auscultation without rales, wheezing or rhonchi  ABDOMEN: Soft, non-tender, non-distended MUSCULOSKELETAL:  No edema; No deformity  SKIN: Warm and dry NEUROLOGIC:  Alert and oriented x  3 PSYCHIATRIC:  Normal affect    Signed, Shirlee More, MD  01/25/2019 11:10 AM    [place]

## 2019-01-25 ENCOUNTER — Ambulatory Visit (INDEPENDENT_AMBULATORY_CARE_PROVIDER_SITE_OTHER): Payer: Medicare HMO | Admitting: Cardiology

## 2019-01-25 ENCOUNTER — Other Ambulatory Visit: Payer: Self-pay

## 2019-01-25 ENCOUNTER — Encounter: Payer: Self-pay | Admitting: Cardiology

## 2019-01-25 VITALS — BP 120/72 | HR 86 | Ht 64.0 in | Wt 140.6 lb

## 2019-01-25 DIAGNOSIS — I340 Nonrheumatic mitral (valve) insufficiency: Secondary | ICD-10-CM | POA: Diagnosis not present

## 2019-01-25 DIAGNOSIS — I509 Heart failure, unspecified: Secondary | ICD-10-CM

## 2019-01-25 DIAGNOSIS — R0602 Shortness of breath: Secondary | ICD-10-CM

## 2019-01-25 DIAGNOSIS — I484 Atypical atrial flutter: Secondary | ICD-10-CM

## 2019-01-25 DIAGNOSIS — Z7901 Long term (current) use of anticoagulants: Secondary | ICD-10-CM | POA: Diagnosis not present

## 2019-01-25 DIAGNOSIS — Z01812 Encounter for preprocedural laboratory examination: Secondary | ICD-10-CM

## 2019-01-25 NOTE — Patient Instructions (Addendum)
Medication Instructions:  Your physician recommends that you continue on your current medications as directed. Please refer to the Current Medication list given to you today.  If you need a refill on your cardiac medications before your next appointment, please call your pharmacy.   Lab work: Your physician recommends that you return for lab work today: CBC, BMP, ProBNP.   If you have labs (blood work) drawn today and your tests are completely normal, you will receive your results only by: Marland Kitchen MyChart Message (if you have MyChart) OR . A paper copy in the mail If you have any lab test that is abnormal or we need to change your treatment, we will call you to review the results.  Testing/Procedures: You had an EKG today.   You are scheduled for a  Cardioversion on Friday, 01/29/2019, with Dr. Bettina Gavia at 9:00 am.  [facility] at [facility] at 7:00 am.   DIET: Nothing to eat or drink after midnight except a sip of water with medications (see medication instructions below)  Medication Instructions: Hold furosemide (lasix)   Continue your anticoagulant: eliquis You will need to continue your anticoagulant after your procedure until you are told by your  Provider that it is safe to stop   Labs: None needed.   You must have a responsible person to drive you home and stay in the waiting area during your procedure. Failure to do so could result in cancellation.  Bring your insurance cards.  *Special Note: Every effort is made to have your procedure done on time. Occasionally there are emergencies that occur at the hospital that may cause delays. Please be patient if a delay does occur.    Follow-Up: At [facility], you and your health needs are our priority.  As part of our continuing mission to provide you with exceptional heart care, we have created designated Provider Care Teams.  These Care Teams include your primary Cardiologist (physician) and Advanced  Practice Providers (APPs -  Physician Assistants and Nurse Practitioners) who all work together to provide you with the care you need, when you need it. You will need a follow up appointment in 4 weeks.     Electrical Cardioversion  Electrical cardioversion is the delivery of a jolt of electricity to restore a normal rhythm to the heart. A rhythm that is too fast or is not regular keeps the heart from pumping well. In this procedure, sticky patches or metal paddles are placed on the chest to deliver electricity to the heart from a device. This procedure may be done in an emergency if:  There is low or no blood pressure as a result of the heart rhythm.  Normal rhythm must be restored as fast as possible to protect the brain and heart from further damage.  It may save a life. This procedure may also be done for irregular or fast heart rhythms that are not immediately life-threatening. Tell a health care provider about:  Any allergies you have.  All medicines you are taking, including vitamins, herbs, eye drops, creams, and over-the-counter medicines.  Any problems you or family members have had with anesthetic medicines.  Any blood disorders you have.  Any surgeries you have had.  Any medical conditions you have.  Whether you are pregnant or may be pregnant. What are the risks? Generally, this is a safe procedure. However, problems may occur, including:  Allergic reactions to medicines.  A blood clot that breaks free and travels to other parts  of your body.  The possible return of an abnormal heart rhythm within hours or days after the procedure.  Your heart stopping (cardiac arrest). This is rare. What happens before the procedure? Medicines  Your health care provider may have you start taking: ? Blood-thinning medicines (anticoagulants) so your blood does not clot as easily. ? Medicines may be given to help stabilize your heart rate and rhythm.  Ask your health care  provider about changing or stopping your regular medicines. This is especially important if you are taking diabetes medicines or blood thinners. General instructions  Plan to have someone take you home from the hospital or clinic.  If you will be going home right after the procedure, plan to have someone with you for 24 hours.  Follow instructions from your health care provider about eating or drinking restrictions. What happens during the procedure?  To lower your risk of infection: ? Your health care team will wash or sanitize their hands. ? Your skin will be washed with soap.  An IV tube will be inserted into one of your veins.  You will be given a medicine to help you relax (sedative).  Sticky patches (electrodes) or metal paddles may be placed on your chest.  An electrical shock will be delivered. The procedure may vary among health care providers and hospitals. What happens after the procedure?   Your blood pressure, heart rate, breathing rate, and blood oxygen level will be monitored until the medicines you were given have worn off.  Do not drive for 24 hours if you were given a sedative.  Your heart rhythm will be watched to make sure it does not change. This information is not intended to replace advice given to you by your health care provider. Make sure you discuss any questions you have with your health care provider. Document Released: 03/29/2002 Document Revised: 03/21/2017 Document Reviewed: 10/13/2015 Elsevier Patient Education  2020 [employer].

## 2019-01-26 ENCOUNTER — Other Ambulatory Visit: Payer: Self-pay

## 2019-01-26 DIAGNOSIS — Z23 Encounter for immunization: Secondary | ICD-10-CM | POA: Diagnosis not present

## 2019-01-26 LAB — CBC
Hematocrit: 42.1 % (ref 34.0–46.6)
Hemoglobin: 14.1 g/dL (ref 11.1–15.9)
MCH: 28.7 pg (ref 26.6–33.0)
MCHC: 33.5 g/dL (ref 31.5–35.7)
MCV: 86 fL (ref 79–97)
Platelets: 230 10*3/uL (ref 150–450)
RBC: 4.91 x10E6/uL (ref 3.77–5.28)
RDW: 14.7 % (ref 11.7–15.4)
WBC: 6.4 10*3/uL (ref 3.4–10.8)

## 2019-01-26 LAB — BASIC METABOLIC PANEL
BUN/Creatinine Ratio: 13 (ref 12–28)
BUN: 13 mg/dL (ref 8–27)
CO2: 23 mmol/L (ref 20–29)
Calcium: 9.9 mg/dL (ref 8.7–10.3)
Chloride: 100 mmol/L (ref 96–106)
Creatinine, Ser: 0.99 mg/dL (ref 0.57–1.00)
GFR calc Af Amer: 64 mL/min/{1.73_m2} (ref >59)
GFR calc non Af Amer: 56 mL/min/{1.73_m2} — ABNORMAL LOW (ref >59)
Glucose: 89 mg/dL (ref 65–99)
Potassium: 4.3 mmol/L (ref 3.5–5.2)
Sodium: 139 mmol/L (ref 134–144)

## 2019-01-26 LAB — PRO B NATRIURETIC PEPTIDE: NT-Pro BNP: 1801 pg/mL — ABNORMAL HIGH (ref 0–738)

## 2019-01-26 NOTE — Patient Outreach (Signed)
[facility]) Care Management  01/26/2019  Marcia Leblanc 08-May-1940 [id number]   Telephone call to patient for follow up.  No answer.  Unable to leave a message.    Plan: RN CM will attempt patient again within 4 business days and send letter.      Marcia Baseman, RN, MSN [place] Management Care Management Coordinator Direct Line [phone] Cell [phone] Toll Free: [phone]  Fax: [phone]

## 2019-01-28 ENCOUNTER — Other Ambulatory Visit: Payer: Self-pay

## 2019-01-28 NOTE — Patient Outreach (Signed)
[facility]) Care Management  01/28/2019  Marcia Leblanc [birth year] [id number]   Telephone call to patient for follow up.  No answer.  Unable to leave a message.    Plan: RN CM will attempt patient again within 4 business days.  Jone Baseman, RN, MSN [place] Management Care Management Coordinator Direct Line [phone] Cell [phone] Toll Free: [phone]  Fax: [phone]

## 2019-01-29 ENCOUNTER — Ambulatory Visit: Payer: Self-pay

## 2019-01-29 ENCOUNTER — Telehealth: Payer: Self-pay | Admitting: Cardiology

## 2019-01-29 DIAGNOSIS — Z96643 Presence of artificial hip joint, bilateral: Secondary | ICD-10-CM | POA: Diagnosis not present

## 2019-01-29 DIAGNOSIS — Z79899 Other long term (current) drug therapy: Secondary | ICD-10-CM | POA: Diagnosis not present

## 2019-01-29 DIAGNOSIS — Z87891 Personal history of nicotine dependence: Secondary | ICD-10-CM | POA: Diagnosis not present

## 2019-01-29 DIAGNOSIS — I484 Atypical atrial flutter: Secondary | ICD-10-CM | POA: Diagnosis not present

## 2019-01-29 DIAGNOSIS — I340 Nonrheumatic mitral (valve) insufficiency: Secondary | ICD-10-CM | POA: Diagnosis not present

## 2019-01-29 DIAGNOSIS — I5032 Chronic diastolic (congestive) heart failure: Secondary | ICD-10-CM | POA: Diagnosis not present

## 2019-01-29 DIAGNOSIS — Z7901 Long term (current) use of anticoagulants: Secondary | ICD-10-CM | POA: Diagnosis not present

## 2019-01-29 DIAGNOSIS — I4891 Unspecified atrial fibrillation: Secondary | ICD-10-CM | POA: Diagnosis not present

## 2019-01-29 NOTE — Telephone Encounter (Signed)
Had cardioversion yesterday and was told to follow up in 2 weeks but pt was already forced in for appt in 4 weeks

## 2019-02-01 ENCOUNTER — Other Ambulatory Visit: Payer: Self-pay

## 2019-02-01 NOTE — Telephone Encounter (Signed)
Called and informed patient that Dr. Bettina Gavia does not have any available appointments before her scheduled office visit on 02/22/2019 at 10:40 am in the [place] office. Patient offered a sooner appointment with Dr. Bettina Gavia or Laurann Montana, NP in the [facility] office but patient declined as she does not want to travel to [place]. Advised patient to contact our office with any further questions or concerns before her scheduled appointment. Patient is agreeable and verbalized understanding. No further questions.

## 2019-02-01 NOTE — Patient Outreach (Signed)
[facility]) Care Management  02/01/2019  Free Reissig 1941-04-07 [id number]   Telephone call to patient for check in. Patient reports that she is doing good.  She had her cardioversion done on Friday and feels good.  Discussed with patient importance of continuing weights regularly and watching salt intake. She verbalized understanding and states that she has been doing well over the last few weeks. She states she has cardiologist follow up in November.  Encouraged patient to keep appointments.  She verbalized understanding and denies any needs presently.   Plan: RN CM will contact patient again in the month of October.    Jone Baseman, RN, MSN [place] Management Care Management Coordinator Direct Line [phone] Cell [phone] Toll Free: [phone]  Fax: [phone]

## 2019-02-15 ENCOUNTER — Other Ambulatory Visit: Payer: Self-pay

## 2019-02-15 NOTE — Patient Outreach (Signed)
[facility]) Care Management  [place]  02/15/2019   Myldred Stickel 1941-04-19 [id number]  Subjective: Telephone call to patient for follow up. Patient states she is doing good.  She states she is weighing and recording her weights. She denies any weight gain and states that she is trying to exercise regularly and watching her diet.  Reviewed with patient heart failure symptoms.  She verbalized understanding. Patient states she plans to go to the dentist today for a check up.  She declines any questions or needs.    Objective:   Encounter Medications:  Outpatient Encounter Medications as of 02/15/2019  Medication Sig  . cholecalciferol (VITAMIN D3) 25 MCG (1000 UT) tablet Take 1,000 Units by mouth daily.  Marland Kitchen diltiazem (CARDIZEM) 30 MG tablet TAKE 2 TABLETS EVERY 8 HOURS  . ELIQUIS 5 MG TABS tablet Take 5 mg by mouth 2 (two) times daily.  . furosemide (LASIX) 20 MG tablet Take 20 mg by mouth daily.  . Multiple Vitamin (MULTIVITAMIN) tablet Take 1 tablet by mouth daily.  . potassium chloride (MICRO-K) 10 MEQ CR capsule Take 10 mEq by mouth daily.  Marland Kitchen Propylene Glycol (SYSTANE BALANCE OP) Apply 1 drop to eye as needed.   No facility-administered encounter medications on file as of 02/15/2019.     Functional Status:  In your present state of health, do you have any difficulty performing the following activities: 02/15/2019 12/24/2018  Hearing? N N  Vision? N N  Difficulty concentrating or making decisions? N N  Walking or climbing stairs? N N  Dressing or bathing? N N  Doing errands, shopping? N N  Preparing Food and eating ? N N  Using the Toilet? N N  In the past six months, have you accidently leaked urine? N N  Do you have problems with loss of bowel control? N N  Managing your Medications? N N  Managing your Finances? N N  Housekeeping or managing your Housekeeping? N N  Some recent data might be hidden    Fall/Depression Screening: Fall Risk   02/15/2019 12/24/2018  Falls in the past year? 0 0  Number falls in past yr: - 0  Injury with Fall? - 0  Risk for fall due to : - Medication side effect  Follow up - Falls evaluation completed;Falls prevention discussed   PHQ 2/9 Scores 02/15/2019 12/24/2018  PHQ - 2 Score 0 0    Assessment: Patient continues to manage her heart failure.  Patient weighing and recording weights regularly.    Plan:  [facility] CM Care Plan Problem One     Most Recent Value  Care Plan Problem One  New diagnosis Atrial flutter, CHF, Mitral valve regurgitation.  Role Documenting the Problem One  Care Management Telephonic [place] for Problem One  Active  [facility] Long Term Goal   Patient will recognize signs of heart failure and when to notify physician within the next 90 days.  [facility] Long Term Goal Start Date  02/01/19  Interventions for Problem One Long Term Goal  RN CM reiterated with patient signs of heart failure and discussed importance of monitoring weights.      RN CM will send update to physician.   RN CM will outreach patient again in the month of November.    Jone Baseman, RN, MSN [place] Management Care Management Coordinator Direct Line [phone] Cell [phone] Toll Free: [phone]  Fax: [phone]

## 2019-02-19 NOTE — Progress Notes (Deleted)
Cardiology Office Note:    Date:  02/22/2019   ID:  Marcia Leblanc, DOB Jun 11, 1940, MRN [id number]  PCP:  Townsend Roger, MD  Cardiologist:  Shirlee More, MD    Referring MD: Townsend Roger, MD    ASSESSMENT:    1. Atypical atrial flutter ([place])   2. Chronic anticoagulation   3. Nonrheumatic mitral valve regurgitation   4. Chronic diastolic heart failure (HCC)    PLAN:    In order of problems listed above:  1. ***   Next appointment: ***   Medication Adjustments/Labs and Tests Ordered: Current medicines are reviewed at length with the patient today.  Concerns regarding medicines are outlined above.  No orders of the defined types were placed in this encounter.  No orders of the defined types were placed in this encounter.   No chief complaint on file.   History of Present Illness:    Marcia Leblanc is a 78 y.o. female with a hx of atrial flutter anticoagulation mitral regurgitation and heart failure  With recent successful outpatient cardioversion and restoration of sinus rhythm last seen 01/25/2019. Compliance with diet, lifestyle and medications: *** Past Medical History:  Diagnosis Date  . Arrhythmia   . Arthritis   . Atrial flutter ([place])   . CHF (congestive heart failure) ([place])   . Diastolic heart failure ([place])   . Dyspnea on exertion   . Mitral regurgitation     Past Surgical History:  Procedure Laterality Date  . CARPAL TUNNEL RELEASE Bilateral 68  . JOINT REPLACEMENT Right 2012   hip  . KNEE ARTHROSCOPY Right    torn cartilage  . TOTAL HIP ARTHROPLASTY Left 09/23/2014   Procedure: TOTAL HIP ARTHROPLASTY ANTERIOR APPROACH;  Surgeon: Marybelle Killings, MD;  Location: [facility];  Service: Orthopedics;  Laterality: Left;  . TUBAL LIGATION  68    Current Medications: No outpatient medications have been marked as taking for the 02/22/19 encounter (Appointment) with Richardo Priest, MD.     Allergies:   Patient has no known allergies.   Social History    Socioeconomic History  . Marital status: Married    Spouse name: Barnabas Lister  . Number of children: 2  . Years of education: Not on file  . Highest education level: Not on file  Occupational History  . Not on file  Social Needs  . Financial resource strain: Not hard at all  . Food insecurity    Worry: Never true    Inability: Never true  . Transportation needs    Medical: No    Non-medical: No  Tobacco Use  . Smoking status: Former Smoker    Packs/day: 0.50    Years: 50.00    Pack years: 25.00    Types: Cigarettes    Quit date: 09/11/2012    Years since quitting: 6.4  . Smokeless tobacco: Never Used  Substance and Sexual Activity  . Alcohol use: Not Currently  . Drug use: No  . Sexual activity: Yes  Lifestyle  . Physical activity    Days per week: Not on file    Minutes per session: Not on file  . Stress: Only a little  Relationships  . Social connections    Talks on phone: More than three times a week    Gets together: More than three times a week    Attends religious service: More than 4 times per year    Active member of club or organization: Yes    Attends meetings  of clubs or organizations: 1 to 4 times per year    Relationship status: Married  Other Topics Concern  . Not on file  Social History Narrative   Pt has some stress recently due to new medical dxs and learning how to self manage. She is coping well and following MD orders.     Family History: The patient's ***family history is negative for Heart attack and Stroke. ROS:   Please see the history of present illness.    All other systems reviewed and are negative.  EKGs/Labs/Other Studies Reviewed:    The following studies were reviewed today:  EKG:  EKG ordered today and personally reviewed.  The ekg ordered today demonstrates ***  Recent Labs: 01/25/2019: BUN 13; Creatinine, Ser 0.99; Hemoglobin 14.1; NT-Pro BNP 1,801; Platelets 230; Potassium 4.3; Sodium 139  Recent Lipid Panel No results found  for: CHOL, TRIG, HDL, CHOLHDL, VLDL, LDLCALC, LDLDIRECT  Physical Exam:    VS:  There were no vitals taken for this visit.    Wt Readings from Last 3 Encounters:  01/25/19 140 lb 9.6 oz (63.8 kg)  01/07/19 142 lb (64.4 kg)  12/18/18 142 lb 6.4 oz (64.6 kg)     GEN: *** Well nourished, well developed in no acute distress HEENT: Normal NECK: No JVD; No carotid bruits LYMPHATICS: No lymphadenopathy CARDIAC: ***RRR, no murmurs, rubs, gallops RESPIRATORY:  Clear to auscultation without rales, wheezing or rhonchi  ABDOMEN: Soft, non-tender, non-distended MUSCULOSKELETAL:  No edema; No deformity  SKIN: Warm and dry NEUROLOGIC:  Alert and oriented x 3 PSYCHIATRIC:  Normal affect    Signed, Shirlee More, MD  02/22/2019 10:36 AM    [place]

## 2019-02-22 ENCOUNTER — Ambulatory Visit: Payer: Medicare HMO | Admitting: Cardiology

## 2019-02-22 ENCOUNTER — Other Ambulatory Visit: Payer: Self-pay | Admitting: *Deleted

## 2019-02-22 MED ORDER — FUROSEMIDE 20 MG PO TABS: 20.0000 mg | ORAL_TABLET | Freq: Every day | ORAL | 0 refills | Status: DC

## 2019-02-22 MED ORDER — POTASSIUM CHLORIDE ER 10 MEQ PO CPCR: 10.0000 meq | ORAL_CAPSULE | Freq: Every day | ORAL | 0 refills | Status: DC

## 2019-02-22 MED ORDER — DILTIAZEM HCL 30 MG PO TABS: ORAL_TABLET | ORAL | 0 refills | Status: DC

## 2019-03-04 DIAGNOSIS — J329 Chronic sinusitis, unspecified: Secondary | ICD-10-CM | POA: Diagnosis not present

## 2019-03-04 DIAGNOSIS — R05 Cough: Secondary | ICD-10-CM | POA: Diagnosis not present

## 2019-03-11 DIAGNOSIS — M810 Age-related osteoporosis without current pathological fracture: Secondary | ICD-10-CM | POA: Diagnosis not present

## 2019-03-11 DIAGNOSIS — C4491 Basal cell carcinoma of skin, unspecified: Secondary | ICD-10-CM | POA: Diagnosis not present

## 2019-03-11 DIAGNOSIS — E663 Overweight: Secondary | ICD-10-CM | POA: Diagnosis not present

## 2019-03-11 DIAGNOSIS — I4892 Unspecified atrial flutter: Secondary | ICD-10-CM | POA: Diagnosis not present

## 2019-03-11 DIAGNOSIS — Z6826 Body mass index (BMI) 26.0-26.9, adult: Secondary | ICD-10-CM | POA: Diagnosis not present

## 2019-03-11 DIAGNOSIS — E559 Vitamin D deficiency, unspecified: Secondary | ICD-10-CM | POA: Diagnosis not present

## 2019-03-11 DIAGNOSIS — Z7901 Long term (current) use of anticoagulants: Secondary | ICD-10-CM | POA: Diagnosis not present

## 2019-03-11 DIAGNOSIS — Z1389 Encounter for screening for other disorder: Secondary | ICD-10-CM | POA: Diagnosis not present

## 2019-03-11 DIAGNOSIS — K5732 Diverticulitis of large intestine without perforation or abscess without bleeding: Secondary | ICD-10-CM | POA: Diagnosis not present

## 2019-03-11 DIAGNOSIS — Z0000 Encounter for general adult medical examination without abnormal findings: Secondary | ICD-10-CM | POA: Diagnosis not present

## 2019-03-15 ENCOUNTER — Other Ambulatory Visit: Payer: Self-pay

## 2019-03-15 NOTE — Patient Outreach (Addendum)
[facility]) Care Management  03/15/2019  Auden Meuser 04/12/1941 [id number]   Telephone call to patient for follow up.  No answer. HIPAA compliant voice message left.  Plan: RN CM will attempt again in the next month and send letter.  Jone Baseman, RN, MSN [place] Management Care Management Coordinator Direct Line [phone] Cell [phone] Toll Free: [phone]  Fax: [phone]

## 2019-03-15 NOTE — Patient Outreach (Signed)
[facility]) Care Management  [place]  03/15/2019   Raushanah Rak 12-02-1940 [id number]  Subjective: Incoming call from patient. She reports that she is doing good. She is weighing daily and denies any symptoms. Patient reports that she is remaining active.  Discussed heart failure, importance of regimen, taking medications as prescribed and following up with physicians regularly.  She verbalized understanding and denies any needs.     Objective:   Encounter Medications:  Outpatient Encounter Medications as of 03/15/2019  Medication Sig  . cholecalciferol (VITAMIN D3) 25 MCG (1000 UT) tablet Take 1,000 Units by mouth daily.  Marland Kitchen diltiazem (CARDIZEM) 30 MG tablet TAKE 2 TABLETS EVERY 8 HOURS  . ELIQUIS 5 MG TABS tablet Take 5 mg by mouth 2 (two) times daily.  . furosemide (LASIX) 20 MG tablet Take 1 tablet (20 mg total) by mouth daily.  . Multiple Vitamin (MULTIVITAMIN) tablet Take 1 tablet by mouth daily.  . potassium chloride (MICRO-K) 10 MEQ CR capsule Take 1 capsule (10 mEq total) by mouth daily.  Marland Kitchen Propylene Glycol (SYSTANE BALANCE OP) Apply 1 drop to eye as needed.   No facility-administered encounter medications on file as of 03/15/2019.     Functional Status:  In your present state of health, do you have any difficulty performing the following activities: 02/15/2019 12/24/2018  Hearing? N N  Vision? N N  Difficulty concentrating or making decisions? N N  Walking or climbing stairs? N N  Dressing or bathing? N N  Doing errands, shopping? N N  Preparing Food and eating ? N N  Using the Toilet? N N  In the past six months, have you accidently leaked urine? N N  Do you have problems with loss of bowel control? N N  Managing your Medications? N N  Managing your Finances? N N  Housekeeping or managing your Housekeeping? N N  Some recent data might be hidden    Fall/Depression Screening: Fall Risk  02/15/2019 12/24/2018  Falls in the past year? 0 0   Number falls in past yr: - 0  Injury with Fall? - 0  Risk for fall due to : - Medication side effect  Follow up - Falls evaluation completed;Falls prevention discussed   PHQ 2/9 Scores 02/15/2019 12/24/2018  PHQ - 2 Score 0 0    Assessment: Patient managing chronic heart failure and other health problems. No recent hospitalizations.   Plan:  [facility] CM Care Plan Problem One     Most Recent Value  Care Plan Problem One  New diagnosis Atrial flutter, CHF, Mitral valve regurgitation.  Role Documenting the Problem One  Care Management Telephonic [place] for Problem One  Active  [facility] Long Term Goal   Patient will recognize signs of heart failure and when to notify physician within the next 90 days.  [facility] Long Term Goal Start Date  02/01/19  Interventions for Problem One Long Term Goal  patient weighing and recording.  Patient watching salt intake and exercising.  Patient able to describe symptoms of heart failure and importance of physician notification.      RN CM will contact patient again in the month of February and patient agreeable.    Jone Baseman, RN, MSN [place] Management Care Management Coordinator Direct Line [phone] Cell [phone] Toll Free: [phone]  Fax: [phone]

## 2019-03-16 ENCOUNTER — Ambulatory Visit: Payer: Self-pay

## 2019-03-22 DIAGNOSIS — Z20828 Contact with and (suspected) exposure to other viral communicable diseases: Secondary | ICD-10-CM | POA: Diagnosis not present

## 2019-03-26 ENCOUNTER — Encounter: Payer: Self-pay | Admitting: Cardiology

## 2019-03-26 ENCOUNTER — Ambulatory Visit: Payer: Medicare HMO | Admitting: Cardiology

## 2019-03-26 ENCOUNTER — Other Ambulatory Visit: Payer: Self-pay

## 2019-03-26 VITALS — Ht 64.0 in | Wt 141.8 lb

## 2019-03-26 DIAGNOSIS — I5032 Chronic diastolic (congestive) heart failure: Secondary | ICD-10-CM | POA: Diagnosis not present

## 2019-03-26 DIAGNOSIS — Z7901 Long term (current) use of anticoagulants: Secondary | ICD-10-CM

## 2019-03-26 DIAGNOSIS — I484 Atypical atrial flutter: Secondary | ICD-10-CM

## 2019-03-26 DIAGNOSIS — I340 Nonrheumatic mitral (valve) insufficiency: Secondary | ICD-10-CM

## 2019-03-26 MED ORDER — AMIODARONE HCL 200 MG PO TABS: 200.0000 mg | ORAL_TABLET | Freq: Every day | ORAL | 0 refills | Status: DC

## 2019-03-26 NOTE — Patient Instructions (Signed)
Medication Instructions:  Your physician has recommended you make the following change in your medication:   START amiodarone (pacerone) 200 mg: Take 1 tablet daily   **Continue to check your EKG on the kardia app every day.   *If you need a refill on your cardiac medications before your next appointment, please call your pharmacy*  Lab Work: None  If you have labs (blood work) drawn today and your tests are completely normal, you will receive your results only by: Marland Kitchen MyChart Message (if you have MyChart) OR . A paper copy in the mail If you have any lab test that is abnormal or we need to change your treatment, we will call you to review the results.  Testing/Procedures: Your physician has requested that you have an echocardiogram. Echocardiography is a painless test that uses sound waves to create images of your heart. It provides your doctor with information about the size and shape of your heart and how well your heart's chambers and valves are working. This procedure takes approximately one hour. There are no restrictions for this procedure.  Follow-Up: At [facility], you and your health needs are our priority.  As part of our continuing mission to provide you with exceptional heart care, we have created designated Provider Care Teams.  These Care Teams include your primary Cardiologist (physician) and Advanced Practice Providers (APPs -  Physician Assistants and Nurse Practitioners) who all work together to provide you with the care you need, when you need it.  Your next appointment:   4 week(s)  The format for your next appointment:   In Person  Provider:   Shirlee More, MD    Amiodarone tablets What is this medicine? AMIODARONE (a MEE oh da rone) is an antiarrhythmic drug. It helps make your heart beat regularly. Because of the side effects caused by this medicine, it is only used when other medicines have not worked. It is usually used for heartbeat problems that may be  life threatening. This medicine may be used for other purposes; ask your health care provider or pharmacist if you have questions. COMMON BRAND NAME(S): Cordarone, Pacerone What should I tell my health care provider before I take this medicine? They need to know if you have any of these conditions:  liver disease  lung disease  other heart problems  thyroid disease  an unusual or allergic reaction to amiodarone, iodine, other medicines, foods, dyes, or preservatives  pregnant or trying to get pregnant  breast-feeding How should I use this medicine? Take this medicine by mouth with a glass of water. Follow the directions on the prescription label. You can take this medicine with or without food. However, you should always take it the same way each time. Take your doses at regular intervals. Do not take your medicine more often than directed. Do not stop taking except on the advice of your doctor or health care professional. A special MedGuide will be given to you by the pharmacist with each prescription and refill. Be sure to read this information carefully each time. Talk to your pediatrician regarding the use of this medicine in children. Special care may be needed. Overdosage: If you think you have taken too much of this medicine contact a poison control center or emergency room at once. NOTE: This medicine is only for you. Do not share this medicine with others. What if I miss a dose? If you miss a dose, take it as soon as you can. If it is almost time  for your next dose, take only that dose. Do not take double or extra doses. What may interact with this medicine? Do not take this medicine with any of the following medications:  abarelix  apomorphine  arsenic trioxide  certain antibiotics like erythromycin, gemifloxacin, levofloxacin, pentamidine  certain medicines for depression like amoxapine, tricyclic antidepressants  certain medicines for fungal infections like  fluconazole, itraconazole, ketoconazole, posaconazole, voriconazole  certain medicines for irregular heart beat like disopyramide, dronedarone, ibutilide, propafenone, sotalol  certain medicines for malaria like chloroquine, halofantrine  cisapride  droperidol  haloperidol  [place]  maprotiline  methadone  phenothiazines like chlorpromazine, mesoridazine, thioridazine  pimozide  ranolazine  red yeast rice  vardenafil This medicine may also interact with the following medications:  antiviral medicines for HIV or AIDS  certain medicines for blood pressure, heart disease, irregular heart beat  certain medicines for cholesterol like atorvastatin, cerivastatin, lovastatin, simvastatin  certain medicines for hepatitis C like sofosbuvir and ledipasvir; sofosbuvir  certain medicines for seizures like phenytoin  certain medicines for thyroid problems  certain medicines that treat or prevent blood clots like warfarin  cholestyramine  cimetidine  clopidogrel  cyclosporine  dextromethorphan  diuretics  dofetilide  fentanyl  general anesthetics  grapefruit juice  lidocaine  loratadine  methotrexate  other medicines that prolong the QT interval (cause an abnormal heart rhythm)  procainamide  quinidine  rifabutin, rifampin, or rifapentine  St. John's Wort  trazodone  ziprasidone This list may not describe all possible interactions. Give your health care provider a list of all the medicines, herbs, non-prescription drugs, or dietary supplements you use. Also tell them if you smoke, drink alcohol, or use illegal drugs. Some items may interact with your medicine. What should I watch for while using this medicine? Your condition will be monitored closely when you first begin therapy. Often, this drug is first started in a hospital or other monitored health care setting. Once you are on maintenance therapy, visit your doctor or health care professional  for regular checks on your progress. Because your condition and use of this medicine carry some risk, it is a good idea to carry an identification card, necklace or bracelet with details of your condition, medications, and doctor or health care professional. Dennis Bast may get drowsy or dizzy. Do not drive, use machinery, or do anything that needs mental alertness until you know how this medicine affects you. Do not stand or sit up quickly, especially if you are an older patient. This reduces the risk of dizzy or fainting spells. This medicine can make you more sensitive to the sun. Keep out of the sun. If you cannot avoid being in the sun, wear protective clothing and use sunscreen. Do not use sun lamps or tanning beds/booths. You should have regular eye exams before and during treatment. Call your doctor if you have blurred vision, see halos, or your eyes become sensitive to light. Your eyes may get dry. It may be helpful to use a lubricating eye solution or artificial tears solution. If you are going to have surgery or a procedure that requires contrast dyes, tell your doctor or health care professional that you are taking this medicine. What side effects may I notice from receiving this medicine? Side effects that you should report to your doctor or health care professional as soon as possible:  allergic reactions like skin rash, itching or hives, swelling of the face, lips, or tongue  blue-gray coloring of the skin  blurred vision, seeing blue green halos,  increased sensitivity of the eyes to light  breathing problems  chest pain  dark urine  fast, irregular heartbeat  feeling faint or light-headed  intolerance to heat or cold  nausea or vomiting  pain and swelling of the scrotum  pain, tingling, numbness in feet, hands  redness, blistering, peeling or loosening of the skin, including inside the mouth  spitting up blood  stomach pain  sweating  unusual or uncontrolled movements  of body  unusually weak or tired  weight gain or loss  yellowing of the eyes or skin Side effects that usually do not require medical attention (report to your doctor or health care professional if they continue or are bothersome):  change in sex drive or performance  constipation  dizziness  headache  loss of appetite  trouble sleeping This list may not describe all possible side effects. Call your doctor for medical advice about side effects. You may report side effects to FDA at 1-800-FDA-1088. Where should I keep my medicine? Keep out of the reach of children. Store at room temperature between 20 and 25 degrees C (68 and 77 degrees F). Protect from light. Keep container tightly closed. Throw away any unused medicine after the expiration date. NOTE: This sheet is a summary. It may not cover all possible information. If you have questions about this medicine, talk to your doctor, pharmacist, or health care provider.  2020 Elsevier/Gold Standard (2018-03-11 13:44:04)

## 2019-03-26 NOTE — Progress Notes (Signed)
Cardiology Office Note:    Date:  03/26/2019   ID:  Marcia Leblanc, DOB [birth year], MRN [id number]  PCP:  Townsend Roger, MD  Cardiologist:  Shirlee More, MD    Referring MD: Townsend Roger, MD    ASSESSMENT:    1. Atypical atrial flutter ([place])   2. Chronic anticoagulation   3. Chronic diastolic heart failure ([place])   4. Nonrheumatic mitral valve regurgitation    PLAN:    In order of problems listed above:  1. Stable maintaining sinus rhythm start low-dose amiodarone.  Follow-up 4 weeks CMP TSH T3-T4 2. Continue anticoagulation moderate stroke risk 3. Compensated continue her diuretic 4. Recheck echocardiogram clinically improved I do not think she will need intervention like MitraClip   Next appointment: 4 weeks   Medication Adjustments/Labs and Tests Ordered: Current medicines are reviewed at length with the patient today.  Concerns regarding medicines are outlined above.  No orders of the defined types were placed in this encounter.  No orders of the defined types were placed in this encounter.   No chief complaint on file.   History of Present Illness:    Marcia Leblanc is a 78 y.o. female with a hx of  atrial flutter anticoagulation mitral regurgitation and heart failure last seen 01/25/2019.  She underwent successful cardioversion to sinus rhythm and resumed atrial flutter.  Her last visit was deferred as there was concern of Covid 19. Compliance with diet, lifestyle and medications: Yes  Her son is a primary care physician he follows her heart rhythm at home using the iPhone adapter and the strip she shows me including today sinus rhythm.  She has made a nice recovery she says a couple times a week she will get a uncertain rhythm I do not think she has been back in flutter again.  We talked about maintaining sinus rhythm, to go ahead and start her on low-dose amiodarone continue anticoagulation recheck echocardiogram as she previously had severe functional mitral  regurgitation and see back in the office in 4 weeks and recheck liver function thyroid.  Her baseline thyroid test shows a normal TSH she had a BMP performed 03/11/2019 with a normal creatinine.  11/16/2018. Past Medical History:  Diagnosis Date  . Arrhythmia   . Arthritis   . Atrial flutter ([place])   . CHF (congestive heart failure) ([place])   . Diastolic heart failure ([place])   . Dyspnea on exertion   . Mitral regurgitation     Past Surgical History:  Procedure Laterality Date  . CARPAL TUNNEL RELEASE Bilateral 68  . JOINT REPLACEMENT Right 2012   hip  . KNEE ARTHROSCOPY Right    torn cartilage  . TOTAL HIP ARTHROPLASTY Left 09/23/2014   Procedure: TOTAL HIP ARTHROPLASTY ANTERIOR APPROACH;  Surgeon: Marybelle Killings, MD;  Location: [facility];  Service: Orthopedics;  Laterality: Left;  . TUBAL LIGATION  68    Current Medications: Current Meds  Medication Sig  . cholecalciferol (VITAMIN D3) 25 MCG (1000 UT) tablet Take 1,000 Units by mouth daily.  Marland Kitchen diltiazem (CARDIZEM) 30 MG tablet TAKE 2 TABLETS EVERY 8 HOURS  . ELIQUIS 5 MG TABS tablet Take 5 mg by mouth 2 (two) times daily.  . furosemide (LASIX) 20 MG tablet Take 1 tablet (20 mg total) by mouth daily.  . Multiple Vitamin (MULTIVITAMIN) tablet Take 1 tablet by mouth daily.  . potassium chloride (MICRO-K) 10 MEQ CR capsule Take 1 capsule (10 mEq total) by mouth daily.  Marland Kitchen Propylene  Glycol (SYSTANE BALANCE OP) Apply 1 drop to eye as needed.     Allergies:   Patient has no known allergies.   Social History   Socioeconomic History  . Marital status: Married    Spouse name: Barnabas Lister  . Number of children: 2  . Years of education: Not on file  . Highest education level: Not on file  Occupational History  . Not on file  Social Needs  . Financial resource strain: Not hard at all  . Food insecurity    Worry: Never true    Inability: Never true  . Transportation needs    Medical: No    Non-medical: No  Tobacco Use  . Smoking status: Former  Smoker    Packs/day: 0.50    Years: 50.00    Pack years: 25.00    Types: Cigarettes    Quit date: 09/11/2012    Years since quitting: 6.5  . Smokeless tobacco: Never Used  Substance and Sexual Activity  . Alcohol use: Not Currently  . Drug use: No  . Sexual activity: Yes  Lifestyle  . Physical activity    Days per week: Not on file    Minutes per session: Not on file  . Stress: Only a little  Relationships  . Social connections    Talks on phone: More than three times a week    Gets together: More than three times a week    Attends religious service: More than 4 times per year    Active member of club or organization: Yes    Attends meetings of clubs or organizations: 1 to 4 times per year    Relationship status: Married  Other Topics Concern  . Not on file  Social History Narrative   Pt has some stress recently due to new medical dxs and learning how to self manage. She is coping well and following MD orders.     Family History: The patient's family history is negative for Heart attack and Stroke. ROS:   Please see the history of present illness.    All other systems reviewed and are negative.  EKGs/Labs/Other Studies Reviewed:    The following studies were reviewed today  Recent Labs: 01/25/2019: BUN 13; Creatinine, Ser 0.99; Hemoglobin 14.1; NT-Pro BNP 1,801; Platelets 230; Potassium 4.3; Sodium 139  Recent Lipid Panel No results found for: CHOL, TRIG, HDL, CHOLHDL, VLDL, LDLCALC, LDLDIRECT  Physical Exam:    VS:  Ht 5\' 4"  (1.626 m)   Wt 141 lb 12.8 oz (64.3 kg)   BMI 24.34 kg/m     Wt Readings from Last 3 Encounters:  03/26/19 141 lb 12.8 oz (64.3 kg)  01/25/19 140 lb 9.6 oz (63.8 kg)  01/07/19 142 lb (64.4 kg)     GEN:  Well nourished, well developed in no acute distress HEENT: Normal NECK: No JVD; No carotid bruits LYMPHATICS: No lymphadenopathy CARDIAC: 1-2 of 6 MR RRR, no  rubs, gallops RESPIRATORY:  Clear to auscultation without rales, wheezing  or rhonchi  ABDOMEN: Soft, non-tender, non-distended MUSCULOSKELETAL:  No edema; No deformity  SKIN: Warm and dry NEUROLOGIC:  Alert and oriented x 3 PSYCHIATRIC:  Normal affect    Signed, Shirlee More, MD  03/26/2019 4:46 PM    [facility]

## 2019-04-05 ENCOUNTER — Other Ambulatory Visit: Payer: Self-pay | Admitting: Cardiology

## 2019-04-08 ENCOUNTER — Ambulatory Visit: Payer: Self-pay

## 2019-04-22 DIAGNOSIS — Z20828 Contact with and (suspected) exposure to other viral communicable diseases: Secondary | ICD-10-CM | POA: Diagnosis not present

## 2019-05-04 ENCOUNTER — Other Ambulatory Visit: Payer: Self-pay | Admitting: Cardiology

## 2019-05-06 ENCOUNTER — Other Ambulatory Visit: Payer: Self-pay | Admitting: *Deleted

## 2019-05-06 MED ORDER — FUROSEMIDE 20 MG PO TABS: 20.0000 mg | ORAL_TABLET | Freq: Every day | ORAL | 1 refills | Status: DC

## 2019-05-10 ENCOUNTER — Other Ambulatory Visit: Payer: Self-pay

## 2019-05-10 MED ORDER — AMIODARONE HCL 200 MG PO TABS: 200.0000 mg | ORAL_TABLET | Freq: Every day | ORAL | 3 refills | Status: DC

## 2019-05-10 MED ORDER — DILTIAZEM HCL 30 MG PO TABS: ORAL_TABLET | ORAL | 3 refills | Status: DC

## 2019-05-11 ENCOUNTER — Other Ambulatory Visit: Payer: Self-pay | Admitting: *Deleted

## 2019-05-11 ENCOUNTER — Other Ambulatory Visit: Payer: Self-pay

## 2019-05-11 MED ORDER — DILTIAZEM HCL 30 MG PO TABS: ORAL_TABLET | ORAL | 3 refills | Status: DC

## 2019-05-11 MED ORDER — POTASSIUM CHLORIDE ER 10 MEQ PO CPCR: 10.0000 meq | ORAL_CAPSULE | Freq: Every day | ORAL | 3 refills | Status: DC

## 2019-05-11 MED ORDER — FUROSEMIDE 20 MG PO TABS: 20.0000 mg | ORAL_TABLET | Freq: Every day | ORAL | 1 refills | Status: DC

## 2019-05-11 MED ORDER — ELIQUIS 5 MG PO TABS: 5.0000 mg | ORAL_TABLET | Freq: Two times a day (BID) | ORAL | 2 refills | Status: DC

## 2019-05-11 NOTE — Progress Notes (Addendum)
Cardiology Office Note:    Date:  05/12/2019   ID:  Marcia Leblanc, DOB [birth year], MRN [id number]  PCP:  Townsend Roger, MD  Cardiologist:  Shirlee More, MD    Referring MD: Townsend Roger, MD    ASSESSMENT:    1. Atypical atrial flutter ([place])   2. Chronic anticoagulation   3. Chronic diastolic heart failure ([place])   4. Nonrheumatic mitral valve regurgitation    PLAN:    In order of problems listed above:  1.Sinus rhythm continue low-dose amiodarone every 6 months needs liver function thyroid test 2.  Continue anticoagulant 3.  Marked improvement no signs or symptoms continue low-dose diuretic check proBNP and recheck echocardiogram regarding MR.  If her MRI is mild or moderate I will consider withdrawing her diuretic and taking as needed in the future. 4.  Hypokalemia recheck her potassium today   Next appointment: 3 months   Medication Adjustments/Labs and Tests Ordered: Current medicines are reviewed at length with the patient today.  Concerns regarding medicines are outlined above.  No orders of the defined types were placed in this encounter.  No orders of the defined types were placed in this encounter.   Chief Complaint  Patient presents with  . Follow-up    4 weeks  . Atrial Fibrillation    On amiodarone    History of Present Illness:    Marcia Leblanc is a 79 y.o. female with a hx of atrial flutter anticoagulation mitral regurgitation and heart failure last seen 01/25/2019.  At that time we planned on doing a repeat cardioversion on amiodarone and she spontaneously converted.  Her son Corene Cornea is a family doctor our community has been following her heart rhythm with the iPhone adapter and she has not had recurrent atrial fibrillation.  Previously she was quite sick with heart failure signs and symptoms of resolved she has no edema shortness of breath palpitation chest pain and she feels back to normal.  She tolerates her anticoagulant and has had no bleeding  complication.  To screen for amiodarone toxicity will check liver function thyroid test and recheck echocardiogram regarding her severe mitral regurgitation which seems to have improved clinically functional in nature.  Recheck an EKG today for QT interval with amiodarone her son has not done a check for several weeks because of the demands of work Compliance with diet, lifestyle and medications: Yes Past Medical History:  Diagnosis Date  . Arrhythmia   . Arthritis   . Atrial flutter ([place])   . CHF (congestive heart failure) ([place])   . Diastolic heart failure ([place])   . Dyspnea on exertion   . Mitral regurgitation     Past Surgical History:  Procedure Laterality Date  . CARPAL TUNNEL RELEASE Bilateral 68  . JOINT REPLACEMENT Right 2012   hip  . KNEE ARTHROSCOPY Right    torn cartilage  . TOTAL HIP ARTHROPLASTY Left 09/23/2014   Procedure: TOTAL HIP ARTHROPLASTY ANTERIOR APPROACH;  Surgeon: Marybelle Killings, MD;  Location: [facility];  Service: Orthopedics;  Laterality: Left;  . TUBAL LIGATION  68    Current Medications: Current Meds  Medication Sig  . amiodarone (PACERONE) 200 MG tablet Take 1 tablet (200 mg total) by mouth daily.  . cholecalciferol (VITAMIN D3) 25 MCG (1000 UT) tablet Take 1,000 Units by mouth daily.  Marland Kitchen diltiazem (CARDIZEM) 30 MG tablet Take 2 tablets by mouth every 8 hours  . ELIQUIS 5 MG TABS tablet Take 1 tablet (5 mg total) by  mouth 2 (two) times daily.  . furosemide (LASIX) 20 MG tablet Take 1 tablet (20 mg total) by mouth daily.  . Multiple Vitamin (MULTIVITAMIN) tablet Take 1 tablet by mouth daily.  . potassium chloride (MICRO-K) 10 MEQ CR capsule Take 1 capsule (10 mEq total) by mouth daily.  Marland Kitchen Propylene Glycol (SYSTANE BALANCE OP) Apply 1 drop to eye as needed.     Allergies:   Patient has no known allergies.   Social History   Socioeconomic History  . Marital status: Married    Spouse name: Barnabas Lister  . Number of children: 2  . Years of education: Not on file    . Highest education level: Not on file  Occupational History  . Not on file  Tobacco Use  . Smoking status: Former Smoker    Packs/day: 0.50    Years: 50.00    Pack years: 25.00    Types: Cigarettes    Quit date: 09/11/2012    Years since quitting: 6.6  . Smokeless tobacco: Never Used  Substance and Sexual Activity  . Alcohol use: Not Currently  . Drug use: No  . Sexual activity: Yes  Other Topics Concern  . Not on file  Social History Narrative   Pt has some stress recently due to new medical dxs and learning how to self manage. She is coping well and following MD orders.   Social Determinants of Health   Financial Resource Strain: Low Risk   . Difficulty of Paying Living Expenses: Not hard at all  Food Insecurity: No Food Insecurity  . Worried About Charity fundraiser in the Last Year: Never true  . Ran Out of Food in the Last Year: Never true  Transportation Needs: No Transportation Needs  . Lack of Transportation (Medical): No  . Lack of Transportation (Non-Medical): No  Physical Activity:   . Days of Exercise per Week: Not on file  . Minutes of Exercise per Session: Not on file  Stress: No Stress Concern Present  . Feeling of Stress : Only a little  Social Connections: Not Isolated  . Frequency of Communication with Friends and Family: More than three times a week  . Frequency of Social Gatherings with Friends and Family: More than three times a week  . Attends Religious Services: More than 4 times per year  . Active Member of Clubs or Organizations: Yes  . Attends Archivist Meetings: 1 to 4 times per year  . Marital Status: Married     Family History: The patient's family history is negative for Heart attack and Stroke. ROS:   Please see the history of present illness.    All other systems reviewed and are negative.  EKGs/Labs/Other Studies Reviewed:    The following studies were reviewed today:  EKG:  EKG ordered today and unfortunately left  the office before the EKG was done and should be brought back in upon addendum to this note  She returned to my office today for EKG maintaining sinus rhythm she has incomplete right bundle branch block atrial premature beats 1 PVC and has repolarization changes with LVH. Recent Labs: 01/25/2019: BUN 13; Creatinine, Ser 0.99; Hemoglobin 14.1; NT-Pro BNP 1,801; Platelets 230; Potassium 4.3; Sodium 139  Recent Lipid Panel No results found for: CHOL, TRIG, HDL, CHOLHDL, VLDL, LDLCALC, LDLDIRECT  Physical Exam:    VS:  BP (!) 150/80 (BP Location: Right Arm, Patient Position: Sitting, Cuff Size: Normal)   Pulse 89   Ht 5\' 4"  (1.626  m)   Wt 142 lb (64.4 kg)   SpO2 95%   BMI 24.37 kg/m     Wt Readings from Last 3 Encounters:  05/12/19 142 lb (64.4 kg)  03/26/19 141 lb 12.8 oz (64.3 kg)  01/25/19 140 lb 9.6 oz (63.8 kg)  Pete 1 3280  GEN: Looks remarkably improved well nourished, well developed in no acute distress HEENT: Normal NECK: No JVD; No carotid bruits LYMPHATICS: No lymphadenopathy CARDIAC: 1 of 6 apical MR RRR, no murmurs, rubs, gallops RESPIRATORY:  Clear to auscultation without rales, wheezing or rhonchi  ABDOMEN: Soft, non-tender, non-distended MUSCULOSKELETAL:  No edema; No deformity  SKIN: Warm and dry NEUROLOGIC:  Alert and oriented x 3 PSYCHIATRIC:  Normal affect    Signed, Shirlee More, MD  05/12/2019 3:04 PM    [facility]

## 2019-05-12 ENCOUNTER — Other Ambulatory Visit: Payer: Self-pay

## 2019-05-12 ENCOUNTER — Encounter: Payer: Self-pay | Admitting: Cardiology

## 2019-05-12 ENCOUNTER — Ambulatory Visit (INDEPENDENT_AMBULATORY_CARE_PROVIDER_SITE_OTHER): Payer: Medicare HMO | Admitting: Cardiology

## 2019-05-12 VITALS — BP 150/80 | HR 89 | Ht 64.0 in | Wt 142.0 lb

## 2019-05-12 DIAGNOSIS — I5032 Chronic diastolic (congestive) heart failure: Secondary | ICD-10-CM | POA: Diagnosis not present

## 2019-05-12 DIAGNOSIS — Z7901 Long term (current) use of anticoagulants: Secondary | ICD-10-CM | POA: Diagnosis not present

## 2019-05-12 DIAGNOSIS — I340 Nonrheumatic mitral (valve) insufficiency: Secondary | ICD-10-CM | POA: Diagnosis not present

## 2019-05-12 DIAGNOSIS — I484 Atypical atrial flutter: Secondary | ICD-10-CM | POA: Diagnosis not present

## 2019-05-12 MED ORDER — DILTIAZEM HCL 30 MG PO TABS: ORAL_TABLET | ORAL | 0 refills | Status: DC

## 2019-05-12 NOTE — Patient Instructions (Addendum)
Medication Instructions: Your physician recommends that you continue on your current medications as directed. Please refer to the Current Medication list given to you today.  *If you need a refill on your cardiac medications before your next appointment, please call your pharmacy*  Lab Work: Your physician recommends that you have the following labs done:  CMP, ProBNP, TSH, T4 and T3   If you have labs (blood work) drawn today and your tests are completely normal, you will receive your results only by: Marland Kitchen MyChart Message (if you have MyChart) OR . A paper copy in the mail If you have any lab test that is abnormal or we need to change your treatment, we will call you to review the results.  Testing/Procedures:  Your physician has requested that you have an echocardiogram. Echocardiography is a painless test that uses sound waves to create images of your heart. It provides your doctor with information about the size and shape of your heart and how well your heart's chambers and valves are working. This procedure takes approximately one hour. There are no restrictions for this procedure.    Follow-Up: At [facility], you and your health needs are our priority.  As part of our continuing mission to provide you with exceptional heart care, we have created designated Provider Care Teams.  These Care Teams include your primary Cardiologist (physician) and Advanced Practice Providers (APPs -  Physician Assistants and Nurse Practitioners) who all work together to provide you with the care you need, when you need it.  Your next appointment:    3 Months  The format for your next appointment:   In Person  Provider:   Dr. Shirlee More  Other Instructions   Echocardiogram An echocardiogram is a procedure that uses painless sound waves (ultrasound) to produce an image of the heart. Images from an echocardiogram can provide important information about:  Signs of coronary artery disease  (CAD).  Aneurysm detection. An aneurysm is a weak or damaged part of an artery wall that bulges out from the normal force of blood pumping through the body.  Heart size and shape. Changes in the size or shape of the heart can be associated with certain conditions, including heart failure, aneurysm, and CAD.  Heart muscle function.  Heart valve function.  Signs of a past heart attack.  Fluid buildup around the heart.  Thickening of the heart muscle.  A tumor or infectious growth around the heart valves. Tell a health care provider about:  Any allergies you have.  All medicines you are taking, including vitamins, herbs, eye drops, creams, and over-the-counter medicines.  Any blood disorders you have.  Any surgeries you have had.  Any medical conditions you have.  Whether you are pregnant or may be pregnant. What are the risks? Generally, this is a safe procedure. However, problems may occur, including:  Allergic reaction to dye (contrast) that may be used during the procedure. What happens before the procedure? No specific preparation is needed. You may eat and drink normally. What happens during the procedure?   An IV tube may be inserted into one of your veins.  You may receive contrast through this tube. A contrast is an injection that improves the quality of the pictures from your heart.  A gel will be applied to your chest.  A wand-like tool (transducer) will be moved over your chest. The gel will help to transmit the sound waves from the transducer.  The sound waves will harmlessly bounce off of  your heart to allow the heart images to be captured in real-time motion. The images will be recorded on a computer. The procedure may vary among health care providers and hospitals. What happens after the procedure?  You may return to your normal, everyday life, including diet, activities, and medicines, unless your health care provider tells you not to do  that. Summary  An echocardiogram is a procedure that uses painless sound waves (ultrasound) to produce an image of the heart.  Images from an echocardiogram can provide important information about the size and shape of your heart, heart muscle function, heart valve function, and fluid buildup around your heart.  You do not need to do anything to prepare before this procedure. You may eat and drink normally.  After the echocardiogram is completed, you may return to your normal, everyday life, unless your health care provider tells you not to do that. This information is not intended to replace advice given to you by your health care provider. Make sure you discuss any questions you have with your health care provider. Document Revised: 07/30/2018 Document Reviewed: 05/11/2016 Elsevier Patient Education  [place].

## 2019-05-13 ENCOUNTER — Telehealth: Payer: Self-pay

## 2019-05-13 LAB — COMPREHENSIVE METABOLIC PANEL
ALT: 11 IU/L (ref 0–32)
AST: 14 IU/L (ref 0–40)
Albumin/Globulin Ratio: 1.7 (ref 1.2–2.2)
Albumin: 4.1 g/dL (ref 3.7–4.7)
Alkaline Phosphatase: 72 IU/L (ref 39–117)
BUN/Creatinine Ratio: 13 (ref 12–28)
BUN: 12 mg/dL (ref 8–27)
Bilirubin Total: 0.3 mg/dL (ref 0.0–1.2)
CO2: 28 mmol/L (ref 20–29)
Calcium: 9.6 mg/dL (ref 8.7–10.3)
Chloride: 103 mmol/L (ref 96–106)
Creatinine, Ser: 0.94 mg/dL (ref 0.57–1.00)
GFR calc Af Amer: 68 mL/min/{1.73_m2} (ref >59)
GFR calc non Af Amer: 59 mL/min/{1.73_m2} — ABNORMAL LOW (ref >59)
Globulin, Total: 2.4 g/dL (ref 1.5–4.5)
Glucose: 82 mg/dL (ref 65–99)
Potassium: 4 mmol/L (ref 3.5–5.2)
Sodium: 142 mmol/L (ref 134–144)
Total Protein: 6.5 g/dL (ref 6.0–8.5)

## 2019-05-13 LAB — PRO B NATRIURETIC PEPTIDE: NT-Pro BNP: 411 pg/mL (ref 0–738)

## 2019-05-13 LAB — T4: T4, Total: 8.7 ug/dL (ref 4.5–12.0)

## 2019-05-13 LAB — TSH: TSH: 1.81 u[IU]/mL (ref 0.450–4.500)

## 2019-05-13 LAB — T3: T3, Total: 80 ng/dL (ref 71–180)

## 2019-05-13 MED ORDER — FUROSEMIDE 20 MG PO TABS: ORAL_TABLET | ORAL | 1 refills | Status: DC

## 2019-05-13 NOTE — Telephone Encounter (Signed)
Pt verbalized understanding of her lab results and will take her Lasix 20 mg PRN for her weight uo 3 lbs from her 142 lbs baseline or if she starts having edema.   Pt to call if she is consistently having to take her Lasix every day.

## 2019-05-13 NOTE — Telephone Encounter (Signed)
-----   Message from Richardo Priest, MD sent at 05/13/2019 11:02 AM EST ----- Normal or stable result  All are good the test for heart failure is good and there is no indication of any toxicity from amiodarone.  If she likes she could start to take her diuretic as needed she knows her weight is up 3 pounds from baseline or has edema.

## 2019-05-20 ENCOUNTER — Other Ambulatory Visit: Payer: Medicare HMO

## 2019-05-20 DIAGNOSIS — T23001A Burn of unspecified degree of right hand, unspecified site, initial encounter: Secondary | ICD-10-CM | POA: Diagnosis not present

## 2019-05-26 ENCOUNTER — Ambulatory Visit (INDEPENDENT_AMBULATORY_CARE_PROVIDER_SITE_OTHER): Payer: Medicare HMO

## 2019-05-26 ENCOUNTER — Other Ambulatory Visit: Payer: Self-pay

## 2019-05-26 DIAGNOSIS — I340 Nonrheumatic mitral (valve) insufficiency: Secondary | ICD-10-CM | POA: Diagnosis not present

## 2019-05-26 DIAGNOSIS — I5032 Chronic diastolic (congestive) heart failure: Secondary | ICD-10-CM

## 2019-05-26 DIAGNOSIS — I484 Atypical atrial flutter: Secondary | ICD-10-CM

## 2019-05-26 NOTE — Progress Notes (Signed)
Complete echocardiogram has been performed.  Jimmy Clare Casto RDCS, RVT 

## 2019-06-08 ENCOUNTER — Other Ambulatory Visit: Payer: Self-pay

## 2019-06-08 NOTE — Patient Outreach (Signed)
[facility]) Care Management  06/08/2019  Ettel Sipos 07-04-1940 [id number]   Telephone call to patient for quarterly check in.  No answer.  HIPAA compliant voice message left.    Plan: RN CM will send letter and attempt patient again in the month of May.    Jone Baseman, RN, MSN [facility] Care Management Coordinator Direct Line [phone] Cell [phone] Toll Free: [phone]  Fax: [phone]

## 2019-06-15 DIAGNOSIS — Z23 Encounter for immunization: Secondary | ICD-10-CM | POA: Diagnosis not present

## 2019-06-15 DIAGNOSIS — R195 Other fecal abnormalities: Secondary | ICD-10-CM | POA: Diagnosis not present

## 2019-06-15 DIAGNOSIS — I4892 Unspecified atrial flutter: Secondary | ICD-10-CM | POA: Diagnosis not present

## 2019-06-15 DIAGNOSIS — I5189 Other ill-defined heart diseases: Secondary | ICD-10-CM | POA: Diagnosis not present

## 2019-07-02 ENCOUNTER — Other Ambulatory Visit: Payer: Medicare HMO

## 2019-07-18 ENCOUNTER — Other Ambulatory Visit: Payer: Self-pay | Admitting: Cardiology

## 2019-07-22 ENCOUNTER — Telehealth: Payer: Self-pay

## 2019-07-22 DIAGNOSIS — Z0100 Encounter for examination of eyes and vision without abnormal findings: Secondary | ICD-10-CM | POA: Diagnosis not present

## 2019-07-22 DIAGNOSIS — H43819 Vitreous degeneration, unspecified eye: Secondary | ICD-10-CM | POA: Diagnosis not present

## 2019-07-22 DIAGNOSIS — H25813 Combined forms of age-related cataract, bilateral: Secondary | ICD-10-CM | POA: Diagnosis not present

## 2019-07-22 DIAGNOSIS — H40013 Open angle with borderline findings, low risk, bilateral: Secondary | ICD-10-CM | POA: Diagnosis not present

## 2019-07-22 MED ORDER — ELIQUIS 5 MG PO TABS: 5.0000 mg | ORAL_TABLET | Freq: Two times a day (BID) | ORAL | 3 refills | Status: DC

## 2019-07-22 NOTE — Telephone Encounter (Signed)
Patient refill request was sent in from [facility]. Refill order was placed.

## 2019-07-23 DIAGNOSIS — R69 Illness, unspecified: Secondary | ICD-10-CM | POA: Diagnosis not present

## 2019-08-03 DIAGNOSIS — R140 Abdominal distension (gaseous): Secondary | ICD-10-CM | POA: Diagnosis not present

## 2019-08-14 NOTE — Progress Notes (Signed)
Cardiology Office Note:    Date:  08/16/2019   ID:  Marcia Leblanc, DOB 1941-01-15, MRN [id number]  PCP:  Townsend Roger, MD  Cardiologist:  Shirlee More, MD    Referring MD: Townsend Roger, MD    ASSESSMENT:    1. Atypical atrial flutter ([place])   2. Chronic anticoagulation   3. Chronic combined systolic and diastolic heart failure ([place])   4. Nonrheumatic mitral valve regurgitation    PLAN:    In order of problems listed above:  1. She has done well maintained sinus rhythm continue low-dose amiodarone and her current anticoagulant.  For referral to GI if we need to stop her anticoagulant I do not see a problem for endoscopic procedures 2. Heart failure is compensated EF is normalized continue current treatment 3. Elevated blood pressure today resume her diltiazem she had stopped I think it is contributing to her GI complaints 4. Stable moderate mitral regurgitation   Next appointment: 6 months   Medication Adjustments/Labs and Tests Ordered: Current medicines are reviewed at length with the patient today.  Concerns regarding medicines are outlined above.  Orders Placed This Encounter  Procedures  . EKG 12-Lead   No orders of the defined types were placed in this encounter.   No chief complaint on file.   History of Present Illness:    Marcia Leblanc is a 79 y.o. female with a hx of  atrial flutter anticoagulation mitral regurgitation and heart failure  seen 01/25/2019,  at that time we planned on doing a repeat cardioversion on amiodarone and she spontaneously converted.  She was last seen 05/12/2019. Compliance with diet, lifestyle and medications: Yes  From a cardiology perspective she has done well no palpitation shortness of breath edema chest pain and no bleeding complication of her anticoagulant.  Her biggest problem is abdominal pain lower she is having intermittent diarrhea no bleeding she is not having heartburn or dyspepsia has no history of ulcer disease.  She  is pending GI evaluation with Dr. Melina Copa and I told her if she needs to stop her anticoagulant I do not have a problem.  She remains in sinus rhythm today on amiodarone and will check thyroid and a CMP.  With her GI complaints anticoagulants check a CBC for anemia.  Echocardiogram 05/26/2019 showed her ejection fraction normalized 60 to 65% day treatment remains severely enlarged and mitral regurgitation was moderate.  Her proBNP level was markedly improved.   Ref Range & Units 3 mo ago 6 mo ago 7 mo ago  NT-Pro BNP 0 - 738 pg/mL 411  1,801High  CM  1,135High     Past Medical History:  Diagnosis Date  . Arrhythmia   . Arthritis   . Atrial flutter ([place])   . CHF (congestive heart failure) ([place])   . Chronic anticoagulation 12/18/2018  . Diastolic heart failure ([place])   . Dyspnea on exertion   . Mitral regurgitation   . Status post total replacement of left hip 09/23/2014    Past Surgical History:  Procedure Laterality Date  . CARPAL TUNNEL RELEASE Bilateral 68  . JOINT REPLACEMENT Right 2012   hip  . KNEE ARTHROSCOPY Right    torn cartilage  . TOTAL HIP ARTHROPLASTY Left 09/23/2014   Procedure: TOTAL HIP ARTHROPLASTY ANTERIOR APPROACH;  Surgeon: Marybelle Killings, MD;  Location: [facility];  Service: Orthopedics;  Laterality: Left;  . TUBAL LIGATION  68    Current Medications: Current Meds  Medication Sig  . amiodarone (PACERONE)  200 MG tablet Take 1 tablet (200 mg total) by mouth daily.  . cholecalciferol (VITAMIN D3) 25 MCG (1000 UT) tablet Take 1,000 Units by mouth daily.  Marland Kitchen ELIQUIS 5 MG TABS tablet Take 1 tablet (5 mg total) by mouth 2 (two) times daily.  . Multiple Vitamin (MULTIVITAMIN) tablet Take 1 tablet by mouth daily.  Marland Kitchen Propylene Glycol (SYSTANE BALANCE OP) Apply 1 drop to eye as needed.     Allergies:   Patient has no known allergies.   Social History   Socioeconomic History  . Marital status: Married    Spouse name: Barnabas Lister  . Number of children: 2  . Years of education:  Not on file  . Highest education level: Not on file  Occupational History  . Not on file  Tobacco Use  . Smoking status: Former Smoker    Packs/day: 0.50    Years: 50.00    Pack years: 25.00    Types: Cigarettes    Quit date: 09/11/2012    Years since quitting: 6.9  . Smokeless tobacco: Never Used  Substance and Sexual Activity  . Alcohol use: Not Currently  . Drug use: No  . Sexual activity: Yes  Other Topics Concern  . Not on file  Social History Narrative   Pt has some stress recently due to new medical dxs and learning how to self manage. She is coping well and following MD orders.   Social Determinants of Health   Financial Resource Strain: Low Risk   . Difficulty of Paying Living Expenses: Not hard at all  Food Insecurity: No Food Insecurity  . Worried About Charity fundraiser in the Last Year: Never true  . Ran Out of Food in the Last Year: Never true  Transportation Needs: No Transportation Needs  . Lack of Transportation (Medical): No  . Lack of Transportation (Non-Medical): No  Physical Activity:   . Days of Exercise per Week:   . Minutes of Exercise per Session:   Stress: No Stress Concern Present  . Feeling of Stress : Only a little  Social Connections: Not Isolated  . Frequency of Communication with Friends and Family: More than three times a week  . Frequency of Social Gatherings with Friends and Family: More than three times a week  . Attends Religious Services: More than 4 times per year  . Active Member of Clubs or Organizations: Yes  . Attends Archivist Meetings: 1 to 4 times per year  . Marital Status: Married     Family History: The patient's family history is negative for Heart attack and Stroke. ROS:   Please see the history of present illness.    All other systems reviewed and are negative.  EKGs/Labs/Other Studies Reviewed:    The following studies were reviewed today:  EKG:  EKG ordered today and personally reviewed.  The ekg  ordered today demonstrates sinus rhythm incomplete right bundle branch block otherwise normal normal QT interval  Recent Labs: 01/25/2019: Hemoglobin 14.1; Platelets 230 05/12/2019: ALT 11; BUN 12; Creatinine, Ser 0.94; NT-Pro BNP 411; Potassium 4.0; Sodium 142; TSH 1.810  Recent Lipid Panel No results found for: CHOL, TRIG, HDL, CHOLHDL, VLDL, LDLCALC, LDLDIRECT  Physical Exam:    VS:  BP (!) 154/84   Pulse 94   Temp (!) 97.1 F (36.2 C)   Ht 5\' 4"  (1.626 m)   Wt 147 lb 12.8 oz (67 kg)   SpO2 97%   BMI 25.37 kg/m  Wt Readings from Last 3 Encounters:  08/16/19 147 lb 12.8 oz (67 kg)  05/12/19 142 lb (64.4 kg)  03/26/19 141 lb 12.8 oz (64.3 kg)     GEN:  Well nourished, well developed in no acute distress HEENT: Normal NECK: No JVD; No carotid bruits LYMPHATICS: No lymphadenopathy CARDIAC: RRR, no murmurs, rubs, gallops RESPIRATORY:  Clear to auscultation without rales, wheezing or rhonchi  ABDOMEN: Soft, non-tender, non-distended MUSCULOSKELETAL:  No edema; No deformity  SKIN: Warm and dry NEUROLOGIC:  Alert and oriented x 3 PSYCHIATRIC:  Normal affect    Signed, Shirlee More, MD  08/16/2019 2:36 PM    [facility]

## 2019-08-16 ENCOUNTER — Ambulatory Visit: Payer: Medicare HMO | Admitting: Cardiology

## 2019-08-16 ENCOUNTER — Other Ambulatory Visit: Payer: Self-pay

## 2019-08-16 ENCOUNTER — Encounter: Payer: Self-pay | Admitting: Cardiology

## 2019-08-16 VITALS — BP 154/84 | HR 94 | Temp 97.1°F | Ht 64.0 in | Wt 147.8 lb

## 2019-08-16 DIAGNOSIS — Z7901 Long term (current) use of anticoagulants: Secondary | ICD-10-CM | POA: Diagnosis not present

## 2019-08-16 DIAGNOSIS — I5042 Chronic combined systolic (congestive) and diastolic (congestive) heart failure: Secondary | ICD-10-CM | POA: Diagnosis not present

## 2019-08-16 DIAGNOSIS — I484 Atypical atrial flutter: Secondary | ICD-10-CM

## 2019-08-16 DIAGNOSIS — I340 Nonrheumatic mitral (valve) insufficiency: Secondary | ICD-10-CM | POA: Diagnosis not present

## 2019-08-16 DIAGNOSIS — Z79899 Other long term (current) drug therapy: Secondary | ICD-10-CM

## 2019-08-16 MED ORDER — DILTIAZEM HCL 30 MG PO TABS: 30.0000 mg | ORAL_TABLET | Freq: Two times a day (BID) | ORAL | 3 refills | Status: DC

## 2019-08-16 NOTE — Patient Instructions (Signed)
Medication Instructions:  Your physician has recommended you make the following change in your medication:  RESUME: Diltiazem 30 mg take one tablet by mouth twice daily.  *If you need a refill on your cardiac medications before your next appointment, please call your pharmacy*   Lab Work: Your physician recommends that you return for lab work in: TODAY CMP, TSH, T3, T4, CBC If you have labs (blood work) drawn today and your tests are completely normal, you will receive your results only by: Marland Kitchen MyChart Message (if you have MyChart) OR . A paper copy in the mail If you have any lab test that is abnormal or we need to change your treatment, we will call you to review the results.   Testing/Procedures: None   Follow-Up: At [facility], you and your health needs are our priority.  As part of our continuing mission to provide you with exceptional heart care, we have created designated Provider Care Teams.  These Care Teams include your primary Cardiologist (physician) and Advanced Practice Providers (APPs -  Physician Assistants and Nurse Practitioners) who all work together to provide you with the care you need, when you need it.  We recommend signing up for the patient portal called "MyChart".  Sign up information is provided on this After Visit Summary.  MyChart is used to connect with patients for Virtual Visits (Telemedicine).  Patients are able to view lab/test results, encounter notes, upcoming appointments, etc.  Non-urgent messages can be sent to your provider as well.   To learn more about what you can do with MyChart, go to [email or web address].    Your next appointment:   6 month(s)  The format for your next appointment:   In Person  Provider:   Shirlee More, MD   Other Instructions

## 2019-08-17 ENCOUNTER — Telehealth: Payer: Self-pay

## 2019-08-17 LAB — COMPREHENSIVE METABOLIC PANEL
ALT: 49 IU/L — ABNORMAL HIGH (ref 0–32)
AST: 40 IU/L (ref 0–40)
Albumin/Globulin Ratio: 1.4 (ref 1.2–2.2)
Albumin: 3.7 g/dL (ref 3.7–4.7)
Alkaline Phosphatase: 126 IU/L — ABNORMAL HIGH (ref 39–117)
BUN/Creatinine Ratio: 9 — ABNORMAL LOW (ref 12–28)
BUN: 9 mg/dL (ref 8–27)
Bilirubin Total: 0.3 mg/dL (ref 0.0–1.2)
CO2: 23 mmol/L (ref 20–29)
Calcium: 9.1 mg/dL (ref 8.7–10.3)
Chloride: 106 mmol/L (ref 96–106)
Creatinine, Ser: 1 mg/dL (ref 0.57–1.00)
GFR calc Af Amer: 63 mL/min/{1.73_m2} (ref >59)
GFR calc non Af Amer: 54 mL/min/{1.73_m2} — ABNORMAL LOW (ref >59)
Globulin, Total: 2.6 g/dL (ref 1.5–4.5)
Glucose: 81 mg/dL (ref 65–99)
Potassium: 4 mmol/L (ref 3.5–5.2)
Sodium: 143 mmol/L (ref 134–144)
Total Protein: 6.3 g/dL (ref 6.0–8.5)

## 2019-08-17 LAB — CBC
Hematocrit: 36 % (ref 34.0–46.6)
Hemoglobin: 12.2 g/dL (ref 11.1–15.9)
MCH: 29.9 pg (ref 26.6–33.0)
MCHC: 33.9 g/dL (ref 31.5–35.7)
MCV: 88 fL (ref 79–97)
Platelets: 221 10*3/uL (ref 150–450)
RBC: 4.08 x10E6/uL (ref 3.77–5.28)
RDW: 13.7 % (ref 11.7–15.4)
WBC: 7.5 10*3/uL (ref 3.4–10.8)

## 2019-08-17 LAB — TSH+T4F+T3FREE
Free T4: 1.72 ng/dL (ref 0.82–1.77)
T3, Free: 2.1 pg/mL (ref 2.0–4.4)
TSH: 1.21 u[IU]/mL (ref 0.450–4.500)

## 2019-08-17 NOTE — Telephone Encounter (Signed)
Spoke with patient regarding results and recommendation.  Patient verbalizes understanding and is agreeable to plan of care. Advised patient to call back with any issues or concerns.  

## 2019-08-17 NOTE — Telephone Encounter (Signed)
-----   Message from Richardo Priest, MD sent at 08/17/2019  7:46 AM EDT ----- Normal or stable result  No changes

## 2019-08-26 DIAGNOSIS — R109 Unspecified abdominal pain: Secondary | ICD-10-CM | POA: Diagnosis not present

## 2019-08-26 DIAGNOSIS — R197 Diarrhea, unspecified: Secondary | ICD-10-CM | POA: Diagnosis not present

## 2019-08-27 DIAGNOSIS — R197 Diarrhea, unspecified: Secondary | ICD-10-CM | POA: Diagnosis not present

## 2019-08-31 ENCOUNTER — Other Ambulatory Visit: Payer: Self-pay

## 2019-08-31 NOTE — Patient Outreach (Signed)
[facility]) Care Management  [place]  08/31/2019   Domonique Nakahara 1940/05/07 [id number]  Subjective: Telephone call to patient for quarterly disease management follow up. Patient reports she is dealing with colitis but is better.  She states she is scheduled for a colonoscopy on 09-27-19.  Patient denies any any problems with A. Flutter or heart failure presently. Patient reports medication compliance and no needs.     Objective:   Encounter Medications:  Outpatient Encounter Medications as of 08/31/2019  Medication Sig  . amiodarone (PACERONE) 200 MG tablet Take 1 tablet (200 mg total) by mouth daily.  . cholecalciferol (VITAMIN D3) 25 MCG (1000 UT) tablet Take 1,000 Units by mouth daily.  Marland Kitchen diltiazem (CARDIZEM) 30 MG tablet Take 1 tablet (30 mg total) by mouth 2 (two) times daily.  Marland Kitchen ELIQUIS 5 MG TABS tablet Take 1 tablet (5 mg total) by mouth 2 (two) times daily.  . Multiple Vitamin (MULTIVITAMIN) tablet Take 1 tablet by mouth daily.  Marland Kitchen Propylene Glycol (SYSTANE BALANCE OP) Apply 1 drop to eye as needed.   No facility-administered encounter medications on file as of 08/31/2019.    Functional Status:  In your present state of health, do you have any difficulty performing the following activities: 08/31/2019 02/15/2019  Hearing? N N  Vision? N N  Difficulty concentrating or making decisions? N N  Walking or climbing stairs? N N  Dressing or bathing? N N  Doing errands, shopping? N N  Preparing Food and eating ? N N  Using the Toilet? N N  In the past six months, have you accidently leaked urine? N N  Do you have problems with loss of bowel control? N N  Managing your Medications? N N  Managing your Finances? N N  Housekeeping or managing your Housekeeping? N N  Some recent data might be hidden    Fall/Depression Screening: Fall Risk  08/31/2019 02/15/2019 12/24/2018  Falls in the past year? 0 0 0  Number falls in past yr: - - 0  Injury with Fall? -  - 0  Risk for fall due to : - - Medication side effect  Follow up - - Falls evaluation completed;Falls prevention discussed   PHQ 2/9 Scores 08/31/2019 02/15/2019 12/24/2018  PHQ - 2 Score 0 0 0    Assessment: Patient continues to manage chronic illnesses and benefits from disease management follow up.    Plan:  [facility] CM Care Plan Problem One     Most Recent Value  Care Plan Problem One  Knowledge deficit heart failure  Role Documenting the Problem One  Care Management Telephonic Coordinator  Care Plan for Problem One  Active  THN Long Term Goal   Over the next 90 days, patient will continue to demonstrate and/or verbalize understanding of self-health management for long term care of CHF.   THN Long Term Goal Start Date  08/31/19  Interventions for Problem One Long Term Goal  Discussed with patient medication adherence.  RN CM reviewed with patient ways of monitoring heart failure, signs & symptoms, and when to notify physician.     RN CM will outreach again in the month of August and patient agreeable.    Jone Baseman, RN, MSN [place] Management Care Management Coordinator Direct Line [phone] Cell [phone] Toll Free: [phone]  Fax: [phone]

## 2019-10-15 DIAGNOSIS — L989 Disorder of the skin and subcutaneous tissue, unspecified: Secondary | ICD-10-CM | POA: Diagnosis not present

## 2019-11-05 DIAGNOSIS — L578 Other skin changes due to chronic exposure to nonionizing radiation: Secondary | ICD-10-CM | POA: Diagnosis not present

## 2019-11-05 DIAGNOSIS — L570 Actinic keratosis: Secondary | ICD-10-CM | POA: Diagnosis not present

## 2019-11-05 DIAGNOSIS — C44629 Squamous cell carcinoma of skin of left upper limb, including shoulder: Secondary | ICD-10-CM | POA: Diagnosis not present

## 2019-11-05 DIAGNOSIS — C44729 Squamous cell carcinoma of skin of left lower limb, including hip: Secondary | ICD-10-CM | POA: Diagnosis not present

## 2019-11-05 DIAGNOSIS — L821 Other seborrheic keratosis: Secondary | ICD-10-CM | POA: Diagnosis not present

## 2019-11-18 DIAGNOSIS — D0462 Carcinoma in situ of skin of left upper limb, including shoulder: Secondary | ICD-10-CM | POA: Diagnosis not present

## 2019-11-25 DIAGNOSIS — C44719 Basal cell carcinoma of skin of left lower limb, including hip: Secondary | ICD-10-CM | POA: Diagnosis not present

## 2019-11-26 ENCOUNTER — Other Ambulatory Visit: Payer: Self-pay | Admitting: *Deleted

## 2019-11-26 MED ORDER — ELIQUIS 5 MG PO TABS: 5.0000 mg | ORAL_TABLET | Freq: Two times a day (BID) | ORAL | 3 refills | Status: DC

## 2019-11-30 ENCOUNTER — Other Ambulatory Visit: Payer: Self-pay

## 2019-11-30 NOTE — Patient Outreach (Signed)
[facility]) Care Management  11/30/2019  Waverly Tarquinio Mar 30, 1941 [id number]   Telephone call to patient for diease management follow up. No answer.  HIPAA compliant voice message left.    Plan: RN CM will attempt patient again in the month of November and send a letter.    Jone Baseman, RN, MSN [place] Management Care Management Coordinator Direct Line [phone] Cell [phone] Toll Free: [phone]  Fax: [phone]

## 2019-12-16 DIAGNOSIS — Z780 Asymptomatic menopausal state: Secondary | ICD-10-CM | POA: Diagnosis not present

## 2019-12-16 DIAGNOSIS — C44729 Squamous cell carcinoma of skin of left lower limb, including hip: Secondary | ICD-10-CM | POA: Diagnosis not present

## 2019-12-16 DIAGNOSIS — I484 Atypical atrial flutter: Secondary | ICD-10-CM | POA: Diagnosis not present

## 2019-12-16 DIAGNOSIS — M8580 Other specified disorders of bone density and structure, unspecified site: Secondary | ICD-10-CM | POA: Diagnosis not present

## 2019-12-16 DIAGNOSIS — S63104A Unspecified dislocation of right thumb, initial encounter: Secondary | ICD-10-CM | POA: Diagnosis not present

## 2020-01-26 DIAGNOSIS — Z23 Encounter for immunization: Secondary | ICD-10-CM | POA: Diagnosis not present

## 2020-02-29 ENCOUNTER — Other Ambulatory Visit: Payer: Self-pay

## 2020-02-29 NOTE — Patient Outreach (Signed)
[facility]) Care Management  [place]  02/29/2020   Marcia Leblanc [birth year] [id number]  Subjective: Telephone call to patient for disease management follow up. Patient doing well.  Denies swelling or shortness of breath.  Patient has cardia app that tracks her heart rhythm.  She denies any problems with Atrial Fib/Flutter.  Patient not weighing but monitors for swelling and shortness of breath.  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  Encouraged continued health maintenance and heart failure management. She verbalized understanding and voices no concerns.   Objective:   Encounter Medications:  Outpatient Encounter Medications as of 02/29/2020  Medication Sig  . amiodarone (PACERONE) 200 MG tablet Take 1 tablet (200 mg total) by mouth daily.  . cholecalciferol (VITAMIN D3) 25 MCG (1000 UT) tablet Take 1,000 Units by mouth daily.  Marland Kitchen diltiazem (CARDIZEM) 30 MG tablet Take 1 tablet (30 mg total) by mouth 2 (two) times daily.  Marland Kitchen ELIQUIS 5 MG TABS tablet Take 1 tablet (5 mg total) by mouth 2 (two) times daily.  . Multiple Vitamin (MULTIVITAMIN) tablet Take 1 tablet by mouth daily.  Marland Kitchen Propylene Glycol (SYSTANE BALANCE OP) Apply 1 drop to eye as needed.   No facility-administered encounter medications on file as of 02/29/2020.    Functional Status:  In your present state of health, do you have any difficulty performing the following activities: 02/29/2020 08/31/2019  Hearing? N N  Vision? N N  Difficulty concentrating or making decisions? N N  Walking or climbing stairs? N N  Dressing or bathing? N N  Doing errands, shopping? N N  Preparing Food and eating ? N N  Using the Toilet? N N  In the past six months, have you accidently leaked urine? N N  Do you have problems with loss of bowel control? N N  Managing your Medications? N N  Managing your Finances? N N  Housekeeping or managing your Housekeeping? N N  Some recent data might be hidden     Fall/Depression Screening: Fall Risk  02/29/2020 08/31/2019 02/15/2019  Falls in the past year? 0 0 0  Number falls in past yr: - - -  Injury with Fall? - - -  Risk for fall due to : - - -  Follow up - - -   PHQ 2/9 Scores 02/29/2020 08/31/2019 02/15/2019 12/24/2018  PHQ - 2 Score 0 0 0 0    Assessment: Patient continues to manage chronic health conditions and benefits from disease management support.  Goals Addressed            This Visit's Progress   . THN-Track and Manage Fluids and Swelling       Follow Up Date 06/19/20   - call office if I gain more than 2 pounds in one day or 5 pounds in one week - use salt in moderation - watch for swelling in feet, ankles and legs every day    Why is this important?   It is important to check your weight daily and watch how much salt and liquids you have.  It will help you to manage your heart failure.    Notes:     . THN-Track and Manage Heart Rate and Rhythm       Follow Up Date 06/19/20   - check pulse (heart) rate once a day - make a plan to exercise regularly - make a plan to eat healthy - take medicine as prescribed    Why  is this important?   Atrial fibrillation may have no symptoms. Sometimes the symptoms get worse or happen more often.  It is important to keep track of what your symptoms are and when they happen.  A change in symptoms is important to discuss with your doctor or nurse.  Being active and healthy eating will also help you manage your heart condition.     Notes:     . THN-Track and Manage Symptoms       Follow Up Date 06/19/20   - eat more whole grains, fruits and vegetables, lean meats and healthy fats - follow rescue plan if symptoms flare-up - know when to call the doctor    Why is this important?   You will be able to handle your symptoms better if you keep track of them.  Making some simple changes to your lifestyle will help.  Eating healthy is one thing you can do to take good care of  yourself.    Notes:        Plan:  RN CM will contact in February and patient agreeable.    Jone Baseman, RN, MSN [place] Management Care Management Coordinator Direct Line [phone] Cell [phone] Toll Free: [phone]  Fax: [phone]

## 2020-03-08 DIAGNOSIS — R109 Unspecified abdominal pain: Secondary | ICD-10-CM | POA: Diagnosis not present

## 2020-03-08 DIAGNOSIS — R140 Abdominal distension (gaseous): Secondary | ICD-10-CM | POA: Diagnosis not present

## 2020-03-13 DIAGNOSIS — I509 Heart failure, unspecified: Secondary | ICD-10-CM | POA: Insufficient documentation

## 2020-03-13 DIAGNOSIS — M1990 Unspecified osteoarthritis, unspecified site: Secondary | ICD-10-CM | POA: Insufficient documentation

## 2020-03-13 DIAGNOSIS — R0600 Dyspnea, unspecified: Secondary | ICD-10-CM | POA: Insufficient documentation

## 2020-03-13 DIAGNOSIS — R0609 Other forms of dyspnea: Secondary | ICD-10-CM | POA: Insufficient documentation

## 2020-03-13 DIAGNOSIS — I5030 Unspecified diastolic (congestive) heart failure: Secondary | ICD-10-CM | POA: Insufficient documentation

## 2020-03-13 DIAGNOSIS — I499 Cardiac arrhythmia, unspecified: Secondary | ICD-10-CM | POA: Insufficient documentation

## 2020-03-22 ENCOUNTER — Ambulatory Visit: Payer: Medicare HMO | Admitting: Cardiology

## 2020-04-06 DIAGNOSIS — M8580 Other specified disorders of bone density and structure, unspecified site: Secondary | ICD-10-CM | POA: Diagnosis not present

## 2020-04-06 DIAGNOSIS — Z0000 Encounter for general adult medical examination without abnormal findings: Secondary | ICD-10-CM | POA: Diagnosis not present

## 2020-04-06 DIAGNOSIS — Z6827 Body mass index (BMI) 27.0-27.9, adult: Secondary | ICD-10-CM | POA: Diagnosis not present

## 2020-04-06 DIAGNOSIS — I4892 Unspecified atrial flutter: Secondary | ICD-10-CM | POA: Diagnosis not present

## 2020-04-06 DIAGNOSIS — Z8719 Personal history of other diseases of the digestive system: Secondary | ICD-10-CM | POA: Diagnosis not present

## 2020-04-06 DIAGNOSIS — Z8619 Personal history of other infectious and parasitic diseases: Secondary | ICD-10-CM | POA: Diagnosis not present

## 2020-04-06 DIAGNOSIS — Z1389 Encounter for screening for other disorder: Secondary | ICD-10-CM | POA: Diagnosis not present

## 2020-04-06 DIAGNOSIS — E559 Vitamin D deficiency, unspecified: Secondary | ICD-10-CM | POA: Diagnosis not present

## 2020-04-17 DIAGNOSIS — M85832 Other specified disorders of bone density and structure, left forearm: Secondary | ICD-10-CM | POA: Diagnosis not present

## 2020-04-17 DIAGNOSIS — N959 Unspecified menopausal and perimenopausal disorder: Secondary | ICD-10-CM | POA: Diagnosis not present

## 2020-04-24 ENCOUNTER — Other Ambulatory Visit: Payer: Self-pay | Admitting: Cardiology

## 2020-04-25 MED ORDER — ELIQUIS 5 MG PO TABS: 5.0000 mg | ORAL_TABLET | Freq: Two times a day (BID) | ORAL | 3 refills | Status: DC

## 2020-05-09 NOTE — Progress Notes (Signed)
Cardiology Office Note:    Date:  05/11/2020   ID:  Marcia Leblanc, DOB 1940/11/17, MRN [id number]  PCP:  Townsend Roger, MD  Cardiologist:  Shirlee More, MD    Referring MD: Townsend Roger, MD    ASSESSMENT:    1. Atypical atrial flutter ([place])   2. On amiodarone therapy   3. Chronic anticoagulation   4. Chronic combined systolic and diastolic heart failure ([place])   5. Nonrheumatic mitral valve regurgitation   6. Hypertensive heart disease with heart failure ([place])    PLAN:    In order of problems listed above:  1. She continues to do well tainting sinus rhythm on low-dose amiodarone anticoagulated no longer requires a loop diuretic.  Heart failure is compensated BP is at target and mitral regurgitation is not severe.  I will see her back in the office in 6 months consider repeat echocardiogram around that time and continue her present medications.  She is having labs next week and I gave her a note to also check thyroid TSH free T3 and T4 and N-terminal proBNP level   Next appointment: 6 months   Medication Adjustments/Labs and Tests Ordered: Current medicines are reviewed at length with the patient today.  Concerns regarding medicines are outlined above.  Orders Placed This Encounter  Procedures  . EKG 12-Lead   No orders of the defined types were placed in this encounter.   Chief Complaint  Patient presents with  . Atrial Fibrillation    On amiodarone    History of Present Illness:    Marcia Leblanc is a 80 y.o. female with a hx of paroxysmal atrial fibrillation and flutter maintaining sinus rhythm on amiodarone and anticoagulated, chronic combined systolic and diastolic heart failure hypertension and mitral regurgitation last seen 08/16/2019.  Initially she had severe left ventricular dysfunction.Echocardiogram 05/26/2019 showed her ejection fraction normalized 60 to 65% and mitral regurgitation was moderate.  Her proBNP level was markedly improved she is no longer on a  loop diuretic.Marland Kitchen   Compliance with diet, lifestyle and medications: From a cardiology perspective she is doing well compliant with medications no bleeding complications  She has had no chest pain shortness of breath palpitations syncope or edema.  She is having labs next week and I asked them to include thyroid test on amiodarone  Recent labs performed 04/06/2020 show normal CBC hemoglobin 12.3, CMP with a creatinine mildly elevated 1.09 GFR stage III AA 49 cc/min potassium 4.4 sodium 144 normal liver function test, cholesterol 173 LDL 99 triglycerides 111 HDL 54 Past Medical History:  Diagnosis Date  . Arrhythmia   . Arthritis   . Atrial flutter ([place])   . CHF (congestive heart failure) ([place])   . Chronic anticoagulation 12/18/2018  . Diastolic heart failure ([place])   . Dyspnea on exertion   . Mitral regurgitation   . Status post total replacement of left hip 09/23/2014    Past Surgical History:  Procedure Laterality Date  . CARPAL TUNNEL RELEASE Bilateral 68  . JOINT REPLACEMENT Right 2012   hip  . KNEE ARTHROSCOPY Right    torn cartilage  . TOTAL HIP ARTHROPLASTY Left 09/23/2014   Procedure: TOTAL HIP ARTHROPLASTY ANTERIOR APPROACH;  Surgeon: Marybelle Killings, MD;  Location: [place];  Service: Orthopedics;  Laterality: Left;  . TUBAL LIGATION  68    Current Medications: Current Meds  Medication Sig  . amiodarone (PACERONE) 200 MG tablet TAKE 1 TABLET EVERY DAY  . cholecalciferol (VITAMIN D3) 25 MCG (  1000 UT) tablet Take 1,000 Units by mouth daily.  Marland Kitchen diltiazem (CARDIZEM) 30 MG tablet Take 1 tablet (30 mg total) by mouth 2 (two) times daily.  Marland Kitchen ELIQUIS 5 MG TABS tablet Take 1 tablet (5 mg total) by mouth 2 (two) times daily.  . Multiple Vitamin (MULTIVITAMIN) tablet Take 1 tablet by mouth daily.  Marland Kitchen Propylene Glycol (SYSTANE BALANCE OP) Apply 1 drop to eye as needed.     Allergies:   Patient has no known allergies.   Social History   Socioeconomic History  . Marital status: Married     Spouse name: Barnabas Lister  . Number of children: 2  . Years of education: Not on file  . Highest education level: Not on file  Occupational History  . Not on file  Tobacco Use  . Smoking status: Former Smoker    Packs/day: 0.50    Years: 50.00    Pack years: 25.00    Types: Cigarettes    Quit date: 09/11/2012    Years since quitting: 7.6  . Smokeless tobacco: Never Used  Vaping Use  . Vaping Use: Never used  Substance and Sexual Activity  . Alcohol use: Not Currently  . Drug use: No  . Sexual activity: Yes  Other Topics Concern  . Not on file  Social History Narrative   Pt has some stress recently due to new medical dxs and learning how to self manage. She is coping well and following MD orders.   Social Determinants of Health   Financial Resource Strain: Not on file  Food Insecurity: Not on file  Transportation Needs: No Transportation Needs  . Lack of Transportation (Medical): No  . Lack of Transportation (Non-Medical): No  Physical Activity: Not on file  Stress: Not on file  Social Connections: Not on file     Family History: The patient's family history includes Alzheimer's disease in her brother; Cancer in her father; Dementia in her brother; Heart attack in her brother; Osteoporosis in her mother. There is no history of Stroke. ROS:   Please see the history of present illness.    All other systems reviewed and are negative.  EKGs/Labs/Other Studies Reviewed:    The following studies were reviewed today:  EKG:  EKG ordered today and personally reviewed.  The ekg ordered today demonstrates sinus rhythm incomplete right bundle branch block nonspecific ST changes unchanged from previous  Recent Labs: 05/12/2019: NT-Pro BNP 411 08/16/2019: ALT 49; BUN 9; Creatinine, Ser 1.00; Hemoglobin 12.2; Platelets 221; Potassium 4.0; Sodium 143; TSH 1.210  Recent Lipid Panel No results found for: CHOL, TRIG, HDL, CHOLHDL, VLDL, LDLCALC, LDLDIRECT  Physical Exam:    VS:  BP (!)  148/88   Pulse 77   Ht 5\' 5"  (1.651 m)   Wt 151 lb 12.8 oz (68.9 kg)   SpO2 97%   BMI 25.26 kg/m     Wt Readings from Last 3 Encounters:  05/11/20 151 lb 12.8 oz (68.9 kg)  08/16/19 147 lb 12.8 oz (67 kg)  05/12/19 142 lb (64.4 kg)     GEN:  Well nourished, well developed in no acute distress HEENT: Normal NECK: No JVD; No carotid bruits LYMPHATICS: No lymphadenopathy CARDIAC: RRR, no murmurs, rubs, gallops RESPIRATORY:  Clear to auscultation without rales, wheezing or rhonchi  ABDOMEN: Soft, non-tender, non-distended MUSCULOSKELETAL:  No edema; No deformity  SKIN: Warm and dry NEUROLOGIC:  Alert and oriented x 3 PSYCHIATRIC:  Normal affect    Signed, Shirlee More, MD  05/11/2020  8:46 AM    [facility]

## 2020-05-11 ENCOUNTER — Encounter: Payer: Self-pay | Admitting: Cardiology

## 2020-05-11 ENCOUNTER — Ambulatory Visit: Payer: Medicare HMO | Admitting: Cardiology

## 2020-05-11 ENCOUNTER — Other Ambulatory Visit: Payer: Self-pay

## 2020-05-11 VITALS — BP 148/88 | HR 77 | Ht 65.0 in | Wt 151.8 lb

## 2020-05-11 DIAGNOSIS — I484 Atypical atrial flutter: Secondary | ICD-10-CM

## 2020-05-11 DIAGNOSIS — I340 Nonrheumatic mitral (valve) insufficiency: Secondary | ICD-10-CM

## 2020-05-11 DIAGNOSIS — Z7901 Long term (current) use of anticoagulants: Secondary | ICD-10-CM

## 2020-05-11 DIAGNOSIS — I110 Hypertensive heart disease with heart failure: Secondary | ICD-10-CM

## 2020-05-11 DIAGNOSIS — Z79899 Other long term (current) drug therapy: Secondary | ICD-10-CM | POA: Diagnosis not present

## 2020-05-11 DIAGNOSIS — I5042 Chronic combined systolic (congestive) and diastolic (congestive) heart failure: Secondary | ICD-10-CM | POA: Diagnosis not present

## 2020-05-11 NOTE — Patient Instructions (Signed)
Medication Instructions:  Your physician recommends that you continue on your current medications as directed. Please refer to the Current Medication list given to you today.  *If you need a refill on your cardiac medications before your next appointment, please call your pharmacy*   Lab Work: Please have your PCP to a CMP, ProBNP, TSH,T3, T4 next week at your appointment with them.  If you have labs (blood work) drawn today and your tests are completely normal, you will receive your results only by: Marland Kitchen MyChart Message (if you have MyChart) OR . A paper copy in the mail If you have any lab test that is abnormal or we need to change your treatment, we will call you to review the results.   Testing/Procedures: None   Follow-Up: At [facility], you and your health needs are our priority.  As part of our continuing mission to provide you with exceptional heart care, we have created designated Provider Care Teams.  These Care Teams include your primary Cardiologist (physician) and Advanced Practice Providers (APPs -  Physician Assistants and Nurse Practitioners) who all work together to provide you with the care you need, when you need it.  We recommend signing up for the patient portal called "MyChart".  Sign up information is provided on this After Visit Summary.  MyChart is used to connect with patients for Virtual Visits (Telemedicine).  Patients are able to view lab/test results, encounter notes, upcoming appointments, etc.  Non-urgent messages can be sent to your provider as well.   To learn more about what you can do with MyChart, go to [email or web address].    Your next appointment:   6 month(s)  The format for your next appointment:   In Person  Provider:   Shirlee More, MD   Other Instructions

## 2020-05-16 DIAGNOSIS — I484 Atypical atrial flutter: Secondary | ICD-10-CM | POA: Diagnosis not present

## 2020-05-16 DIAGNOSIS — E559 Vitamin D deficiency, unspecified: Secondary | ICD-10-CM | POA: Diagnosis not present

## 2020-05-16 DIAGNOSIS — Z79899 Other long term (current) drug therapy: Secondary | ICD-10-CM | POA: Diagnosis not present

## 2020-05-16 DIAGNOSIS — I110 Hypertensive heart disease with heart failure: Secondary | ICD-10-CM | POA: Diagnosis not present

## 2020-05-16 DIAGNOSIS — R944 Abnormal results of kidney function studies: Secondary | ICD-10-CM | POA: Diagnosis not present

## 2020-05-16 DIAGNOSIS — I5042 Chronic combined systolic (congestive) and diastolic (congestive) heart failure: Secondary | ICD-10-CM | POA: Diagnosis not present

## 2020-05-29 ENCOUNTER — Other Ambulatory Visit: Payer: Self-pay

## 2020-05-29 NOTE — Patient Outreach (Signed)
[facility]) Care Management  05/29/2020  Marcia Leblanc 1940/07/25 [id number]   Telephone call to patient for disease management follow up.   No answer.  HIPAA compliant voice message left.    Plan: If no return call, RN CM will attempt patient again in the month of May.  Jone Baseman, RN, MSN [place] Management Care Management Coordinator Direct Line [phone] Cell [phone] Toll Free: [phone]  Fax: [phone]

## 2020-05-30 ENCOUNTER — Ambulatory Visit: Payer: Self-pay

## 2020-07-06 DIAGNOSIS — Z6827 Body mass index (BMI) 27.0-27.9, adult: Secondary | ICD-10-CM | POA: Diagnosis not present

## 2020-07-06 DIAGNOSIS — M8580 Other specified disorders of bone density and structure, unspecified site: Secondary | ICD-10-CM | POA: Diagnosis not present

## 2020-07-10 DIAGNOSIS — N289 Disorder of kidney and ureter, unspecified: Secondary | ICD-10-CM | POA: Diagnosis not present

## 2020-07-18 DIAGNOSIS — N189 Chronic kidney disease, unspecified: Secondary | ICD-10-CM | POA: Diagnosis not present

## 2020-07-20 DIAGNOSIS — N183 Chronic kidney disease, stage 3 unspecified: Secondary | ICD-10-CM | POA: Diagnosis not present

## 2020-08-03 DIAGNOSIS — M545 Low back pain, unspecified: Secondary | ICD-10-CM | POA: Diagnosis not present

## 2020-08-03 DIAGNOSIS — R1030 Lower abdominal pain, unspecified: Secondary | ICD-10-CM | POA: Diagnosis not present

## 2020-08-23 ENCOUNTER — Other Ambulatory Visit: Payer: Self-pay | Admitting: Cardiology

## 2020-08-24 NOTE — Telephone Encounter (Signed)
Diltiazem 30 mg # 180 sent to pharmacy, patient has recall appointment for July 2022

## 2020-08-25 DIAGNOSIS — J0100 Acute maxillary sinusitis, unspecified: Secondary | ICD-10-CM | POA: Diagnosis not present

## 2020-08-29 ENCOUNTER — Other Ambulatory Visit: Payer: Self-pay

## 2020-08-29 NOTE — Patient Outreach (Signed)
[facility]) Care Management  [place]  08/29/2020   Marcia Leblanc 1941-03-31 [id number]  Subjective: Telephone call to patient for disease management support. Patient reports she is doing good.  She reports that she has some beginnings of kidney disease and was placed on Farxiga.  She is working with the pharmacist at her PCP office for patient assistance as the medication is over $600 for 3 months.  Patient weight 142.8 lbs.  Discussed heart failure and management.    Objective:   Encounter Medications:  Outpatient Encounter Medications as of 08/29/2020  Medication Sig  . amiodarone (PACERONE) 200 MG tablet TAKE 1 TABLET EVERY DAY  . cholecalciferol (VITAMIN D3) 25 MCG (1000 UT) tablet Take 1,000 Units by mouth daily.  . dapagliflozin propanediol (FARXIGA) 10 MG TABS tablet Take 10 mg by mouth daily.  Marland Kitchen diltiazem (CARDIZEM) 30 MG tablet TAKE 1 TABLET TWICE DAILY  . ELIQUIS 5 MG TABS tablet Take 1 tablet (5 mg total) by mouth 2 (two) times daily.  . Multiple Vitamin (MULTIVITAMIN) tablet Take 1 tablet by mouth daily.  Marland Kitchen Propylene Glycol (SYSTANE BALANCE OP) Apply 1 drop to eye as needed.   No facility-administered encounter medications on file as of 08/29/2020.    Functional Status:  In your present state of health, do you have any difficulty performing the following activities: 08/29/2020 02/29/2020  Hearing? N N  Vision? N N  Difficulty concentrating or making decisions? N N  Walking or climbing stairs? N N  Dressing or bathing? N N  Doing errands, shopping? N N  Preparing Food and eating ? N N  Using the Toilet? N N  In the past six months, have you accidently leaked urine? N N  Do you have problems with loss of bowel control? N N  Managing your Medications? N N  Managing your Finances? N N  Housekeeping or managing your Housekeeping? N N  Some recent data might be hidden    Fall/Depression Screening: Fall Risk  08/29/2020 02/29/2020 08/31/2019   Falls in the past year? 0 0 0  Number falls in past yr: - - -  Injury with Fall? - - -  Risk for fall due to : - - -  Follow up - - -   PHQ 2/9 Scores 08/29/2020 02/29/2020 08/31/2019 02/15/2019 12/24/2018  PHQ - 2 Score 0 0 0 0 0    Assessment:  Goals Addressed            This Visit's Progress   . THN-Track and Manage Fluids and Swelling   On track    Timeframe:  Long-Range Goal Priority:  High Start Date:    02/29/20                         Expected End Date:       04/21/21               Follow Up Date 12/20/20   - track weight in diary - use salt in moderation - watch for swelling in feet, ankles and legs every day - weigh myself daily    Why is this important?   It is important to check your weight daily and watch how much salt and liquids you have.  It will help you to manage your heart failure.    Notes: Keep up the great work!!    . THN-Track and Manage Heart Rate and Rhythm   On track  Timeframe:  Long-Range Goal Priority:  High Start Date:  02/29/20                           Expected End Date:   04/21/21                   Follow Up Date 12/20/20   - check pulse (heart) rate once a day - make a plan to eat healthy - take medicine as prescribed    Why is this important?   Atrial fibrillation may have no symptoms. Sometimes the symptoms get worse or happen more often.  It is important to keep track of what your symptoms are and when they happen.  A change in symptoms is important to discuss with your doctor or nurse.  Being active and healthy eating will also help you manage your heart condition.     Notes: 08/29/20 Continue to monitor heart rate and blood pressure.     . COMPLETED: THN-Track and Manage Symptoms       Follow Up Date 06/19/20   - eat more whole grains, fruits and vegetables, lean meats and healthy fats - follow rescue plan if symptoms flare-up - know when to call the doctor    Why is this important?   You will be able to handle your  symptoms better if you keep track of them.  Making some simple changes to your lifestyle will help.  Eating healthy is one thing you can do to take good care of yourself.    Notes:        Plan: RN CM will follow up in August. Follow-up:  Patient agrees to Care Plan and Follow-up.   Jone Baseman, RN, MSN [place] Management Care Management Coordinator Direct Line [phone] Cell [phone] Toll Free: [phone]  Fax: [phone]

## 2020-08-29 NOTE — Patient Instructions (Signed)
Goals Addressed            This Visit's Progress   . THN-Track and Manage Fluids and Swelling   On track    Timeframe:  Long-Range Goal Priority:  High Start Date:    02/29/20                         Expected End Date:       04/21/21               Follow Up Date 12/20/20   - track weight in diary - use salt in moderation - watch for swelling in feet, ankles and legs every day - weigh myself daily    Why is this important?   It is important to check your weight daily and watch how much salt and liquids you have.  It will help you to manage your heart failure.    Notes: Keep up the great work!!    . THN-Track and Manage Heart Rate and Rhythm   On track    Timeframe:  Long-Range Goal Priority:  High Start Date:  02/29/20                           Expected End Date:   04/21/21                   Follow Up Date 12/20/20   - check pulse (heart) rate once a day - make a plan to eat healthy - take medicine as prescribed    Why is this important?   Atrial fibrillation may have no symptoms. Sometimes the symptoms get worse or happen more often.  It is important to keep track of what your symptoms are and when they happen.  A change in symptoms is important to discuss with your doctor or nurse.  Being active and healthy eating will also help you manage your heart condition.     Notes: 08/29/20 Continue to monitor heart rate and blood pressure.     . COMPLETED: THN-Track and Manage Symptoms       Follow Up Date 06/19/20   - eat more whole grains, fruits and vegetables, lean meats and healthy fats - follow rescue plan if symptoms flare-up - know when to call the doctor    Why is this important?   You will be able to handle your symptoms better if you keep track of them.  Making some simple changes to your lifestyle will help.  Eating healthy is one thing you can do to take good care of yourself.    Notes:

## 2020-10-12 DIAGNOSIS — E559 Vitamin D deficiency, unspecified: Secondary | ICD-10-CM | POA: Diagnosis not present

## 2020-10-12 DIAGNOSIS — N1832 Chronic kidney disease, stage 3b: Secondary | ICD-10-CM | POA: Diagnosis not present

## 2020-10-12 DIAGNOSIS — M8580 Other specified disorders of bone density and structure, unspecified site: Secondary | ICD-10-CM | POA: Diagnosis not present

## 2020-11-06 ENCOUNTER — Other Ambulatory Visit: Payer: Self-pay

## 2020-11-06 MED ORDER — ELIQUIS 5 MG PO TABS: 5.0000 mg | ORAL_TABLET | Freq: Two times a day (BID) | ORAL | 0 refills | Status: DC

## 2020-11-06 NOTE — Telephone Encounter (Signed)
Pt's age 80, wt 68.9 kg, SCr 1.09, CrCl 46.27, last ov w/ BM 05/11/20.

## 2020-11-06 NOTE — Telephone Encounter (Signed)
Received a call from [place] stating the patient is requesting a ten day supply of Eliquis rx be sent to the local pharmacy. He did not list a local pharmacy name or number.

## 2020-11-20 ENCOUNTER — Telehealth: Payer: Self-pay

## 2020-11-20 NOTE — Telephone Encounter (Signed)
Patient assistance from [employer] is approved til the end of this year. Confirmation number [id number]. The foundation has mailed a letter of approval to the patient

## 2020-11-27 ENCOUNTER — Other Ambulatory Visit: Payer: Self-pay

## 2020-11-27 NOTE — Progress Notes (Signed)
Cardiology Office Note:    Date:  11/28/2020   ID:  Marcia Leblanc, DOB 1940/05/04, MRN [id number]  PCP:  Townsend Roger, MD  Cardiologist:  Shirlee More, MD    Referring MD: Townsend Roger, MD    ASSESSMENT:    1. Atypical atrial flutter ([place])   2. On amiodarone therapy   3. High risk medication use   4. Chronic anticoagulation   5. Hypertensive heart disease with heart failure (HCC)    PLAN:    In order of problems listed above:  Marcia Leblanc continues to do well maintaining amiodarone sinus rhythm low dosage check liver function thyroid toxicity positive chest x-ray although she exhibits no findings of pulmonary toxicity.  No change in treatment Continue anticoagulant amiodarone Continue to monitor home blood pressure at target and continue her calcium channel blocker.  No longer requires a loop diuretic note she is an SGLT2 inhibitor.   Next appointment: 9 months   Medication Adjustments/Labs and Tests Ordered: Current medicines are reviewed at length with the patient today.  Concerns regarding medicines are outlined above.  Orders Placed This Encounter  Procedures   TSH+T4F+T3Free   Comprehensive metabolic panel   EKG [id number]    No orders of the defined types were placed in this encounter.   Chief Complaint  Patient presents with   Atrial Fibrillation   Anticoagulation   Congestive Heart Failure     History of Present Illness:    Marcia Leblanc is a 80 y.o. female with a hx of  paroxysmal atrial fibrillation and flutter maintaining sinus rhythm on amiodarone and anticoagulated, chronic combined systolic and diastolic heart failure hypertension and mitral regurgitation last seen 08/16/2019.  Initially she had severe left ventricular dysfunction.Echocardiogram 05/26/2019 showed her ejection fraction normalized 60 to 65% and mitral regurgitation was moderate. She was  last seen 05/11/2020.  Compliance with diet, lifestyle and medications: Yes  Overall she is  doing well her home blood pressure runs in the range of 130 140/70-80 does not get high numbers and her weight is unchanged she weighs daily sodium restrictions and monitors her fluid intake. Most recent labs done 04/07/2020 showed lipids at target cholesterol 173 LDL 99 triglycerides 111 HDL 54 hemoglobin 12.3 creatinine 1.25 potassium 4.2 TSH was normal 1.64 She has had no palpitation rapid heart rhythm shortness of breath chest pain syncope and no bleeding complication from her anticoagulation.  Past Medical History:  Diagnosis Date   Arrhythmia    Arthritis    Atrial flutter (HCC)    CHF (congestive heart failure) (HCC)    Chronic anticoagulation 123456   Diastolic heart failure (HCC)    Dyspnea on exertion    Mitral regurgitation    Status post total replacement of left hip 09/23/2014    Past Surgical History:  Procedure Laterality Date   CARPAL TUNNEL RELEASE Bilateral 68   JOINT REPLACEMENT Right 2012   hip   KNEE ARTHROSCOPY Right    torn cartilage   TOTAL HIP ARTHROPLASTY Left 09/23/2014   Procedure: TOTAL HIP ARTHROPLASTY ANTERIOR APPROACH;  Surgeon: Marybelle Killings, MD;  Location: [facility];  Service: Orthopedics;  Laterality: Left;   TUBAL LIGATION  68    Current Medications: Current Meds  Medication Sig   amiodarone (PACERONE) 200 MG tablet TAKE 1 TABLET EVERY DAY   Ascorbic Acid (VITAMIN C) 500 MG CAPS Take 500 mg by mouth daily.   cholecalciferol (VITAMIN D3) 25 MCG (1000 UT) tablet Take 1,000 Units by mouth daily.  dapagliflozin propanediol (FARXIGA) 10 MG TABS tablet Take 10 mg by mouth daily.   diltiazem (CARDIZEM) 30 MG tablet TAKE 1 TABLET TWICE DAILY   ELIQUIS 5 MG TABS tablet Take 1 tablet (5 mg total) by mouth 2 (two) times daily.   Multiple Vitamin (MULTIVITAMIN) tablet Take 1 tablet by mouth daily.   Propylene Glycol (SYSTANE BALANCE OP) Apply 1 drop to eye as needed.     Allergies:   Patient has no known allergies.   Social History   Socioeconomic  History   Marital status: Married    Spouse name: Barnabas Lister   Number of children: 2   Years of education: Not on file   Highest education level: Not on file  Occupational History   Not on file  Tobacco Use   Smoking status: Former    Packs/day: 0.50    Years: 50.00    Pack years: 25.00    Types: Cigarettes    Quit date: 09/11/2012    Years since quitting: 8.2   Smokeless tobacco: Never  Vaping Use   Vaping Use: Never used  Substance and Sexual Activity   Alcohol use: Not Currently   Drug use: No   Sexual activity: Yes  Other Topics Concern   Not on file  Social History Narrative   Pt has some stress recently due to new medical dxs and learning how to self manage. She is coping well and following MD orders.   Social Determinants of Health   Financial Resource Strain: Not on file  Food Insecurity: Not on file  Transportation Needs: No Transportation Needs   Lack of Transportation (Medical): No   Lack of Transportation (Non-Medical): No  Physical Activity: Not on file  Stress: Not on file  Social Connections: Not on file     Family History: The patient's family history includes Alzheimer's disease in her brother; Cancer in her father; Dementia in her brother; Heart attack in her brother; Osteoporosis in her mother. There is no history of Stroke. ROS:   Please see the history of present illness.    All other systems reviewed and are negative.  EKGs/Labs/Other Studies Reviewed:    The following studies were reviewed today:  EKG:  EKG ordered today and personally reviewed.  The ekg ordered today demonstrates sinus rhythm incomplete left bundle branch block she has left atrial abnormality.  Minor nonspecific ST changes    Physical Exam:    VS:  BP (!) 172/92   Pulse 72   Ht '5\' 5"'$  (1.651 m)   Wt 146 lb 3.2 oz (66.3 kg)   SpO2 98%   BMI 24.33 kg/m     Wt Readings from Last 3 Encounters:  11/28/20 146 lb 3.2 oz (66.3 kg)  05/11/20 151 lb 12.8 oz (68.9 kg)  08/16/19  147 lb 12.8 oz (67 kg)     GEN: Appears her age she has a deep gravelly voice well nourished, well developed in no acute distress HEENT: Normal NECK: No JVD; No carotid bruits LYMPHATICS: No lymphadenopathy CARDIAC: RRR, no murmurs, rubs, gallops RESPIRATORY:  Clear to auscultation without rales, wheezing or rhonchi decreased breath sounds ABDOMEN: Soft, non-tender, non-distended MUSCULOSKELETAL:  No edema; No deformity  SKIN: Warm and dry NEUROLOGIC:  Alert and oriented x 3 PSYCHIATRIC:  Normal affect    Signed, Shirlee More, MD  11/28/2020 8:10 AM    [place]

## 2020-11-27 NOTE — Patient Outreach (Signed)
[facility]) Care Management  11/27/2020  Tone Millage 1940-12-03 [id number]   Telephone call to patient for disease management follow up.   No answer.  HIPAA compliant voice message left.    Plan: If no return call, RN CM will attempt patient again in November.  Jone Baseman, RN, MSN [place] Management Care Management Coordinator Direct Line [phone] Cell [phone] Toll Free: [phone]  Fax: [phone]

## 2020-11-28 ENCOUNTER — Encounter: Payer: Self-pay | Admitting: Cardiology

## 2020-11-28 ENCOUNTER — Other Ambulatory Visit: Payer: Self-pay

## 2020-11-28 ENCOUNTER — Ambulatory Visit: Payer: Medicare HMO | Admitting: Cardiology

## 2020-11-28 VITALS — BP 172/92 | HR 72 | Ht 65.0 in | Wt 146.2 lb

## 2020-11-28 DIAGNOSIS — Z79899 Other long term (current) drug therapy: Secondary | ICD-10-CM

## 2020-11-28 DIAGNOSIS — I110 Hypertensive heart disease with heart failure: Secondary | ICD-10-CM

## 2020-11-28 DIAGNOSIS — Z7901 Long term (current) use of anticoagulants: Secondary | ICD-10-CM | POA: Diagnosis not present

## 2020-11-28 DIAGNOSIS — I484 Atypical atrial flutter: Secondary | ICD-10-CM | POA: Diagnosis not present

## 2020-11-28 NOTE — Patient Outreach (Signed)
[facility]) Care Management  [place]  11/28/2020   Marcia Leblanc Jul 21, 1940 [id number]  Subjective: Incoming call from patient. She reports doing good and proud to say she got a 9 month ticket instead of 6 month ticket to see physician. She denies concerns.   Objective:   Encounter Medications:  Outpatient Encounter Medications as of 11/28/2020  Medication Sig   amiodarone (PACERONE) 200 MG tablet TAKE 1 TABLET EVERY DAY   Ascorbic Acid (VITAMIN C) 500 MG CAPS Take 500 mg by mouth daily.   cholecalciferol (VITAMIN D3) 25 MCG (1000 UT) tablet Take 1,000 Units by mouth daily.   dapagliflozin propanediol (FARXIGA) 10 MG TABS tablet Take 10 mg by mouth daily.   diltiazem (CARDIZEM) 30 MG tablet TAKE 1 TABLET TWICE DAILY   ELIQUIS 5 MG TABS tablet Take 1 tablet (5 mg total) by mouth 2 (two) times daily.   Multiple Vitamin (MULTIVITAMIN) tablet Take 1 tablet by mouth daily.   Propylene Glycol (SYSTANE BALANCE OP) Apply 1 drop to eye as needed.   No facility-administered encounter medications on file as of 11/28/2020.    Functional Status:  In your present state of health, do you have any difficulty performing the following activities: 08/29/2020 02/29/2020  Hearing? N N  Vision? N N  Difficulty concentrating or making decisions? N N  Walking or climbing stairs? N N  Dressing or bathing? N N  Doing errands, shopping? N N  Preparing Food and eating ? N N  Using the Toilet? N N  In the past six months, have you accidently leaked urine? N N  Do you have problems with loss of bowel control? N N  Managing your Medications? N N  Managing your Finances? N N  Housekeeping or managing your Housekeeping? N N  Some recent data might be hidden    Fall/Depression Screening: Fall Risk  08/29/2020 02/29/2020 08/31/2019  Falls in the past year? 0 0 0  Number falls in past yr: - - -  Injury with Fall? - - -  Risk for fall due to : - - -  Follow up - - -   PHQ 2/9  Scores 08/29/2020 02/29/2020 08/31/2019 02/15/2019 12/24/2018  PHQ - 2 Score 0 0 0 0 0    Assessment:   Care Plan Care Plan : Heart Failure (Adult)  Updates made by Jon Billings, RN since 11/28/2020 12:00 AM     Problem: Symptom Exacerbation (Heart Failure)      Long-Range Goal: Symptom Exacerbation Prevented or Minimized as evidenced by no acute exacerbation of heart failure   Start Date: 02/29/2020  Expected End Date: 04/21/2021  This Visit's Progress: On track  Recent Progress: On track  Priority: High  Note:   Barriers: Knowledge Evidence-based guidance:  Establish a mutually-agreed-upon early intervention process to communicate with primary care provider when signs/symptoms worsen.  Facilitate timely posthospital discharge or emergency department treatment that includes intensive follow-up via telephone calls, home visit, telehealth monitoring and care at multidisciplinary heart failure clinic.  Notes:     Task: Identify and Minimize Risk of Heart Failure Exacerbation   Due Date: 04/21/2021  Priority: Routine  Responsible User: Jon Billings, RN  Note:   Care Management Activities:    - rescue (action) plan reviewed - self-awareness of signs/symptoms of worsening disease encouraged    Notes: 08/29/20 Patient continues to weigh and watch her diet.  Weight 142.8 lbs 11/28/20 Patient weighs daily.  Weight 144 lbs.   Heart Failure  Management Discussed Please weight daily or as ordered by your doctor Report to your doctor weight gain of 2 -3 pounds in a day or 5 pounds in a week Limit salt intake Monitor for shortness of breath, swelling of feet, ankles or abdomen and weight gain. Never use the saltshaker.  Read all food labels and avoid canned, processed, and pickled foods.   Follow your doctor's recommendations for daily salt intake.      Goals Addressed               This Visit's Progress     THN-Track and Manage Fluids and Swelling as evidenced by patient  reporting daily weights.   On track     Barriers: None Timeframe:  Long-Range Goal Priority:  High Start Date:    02/29/20                         Expected End Date:       04/21/21               Follow Up Date 03/21/21  - track weight in diary - use salt in moderation - watch for swelling in feet, ankles and legs every day - weigh myself daily    Why is this important?   It is important to check your weight daily and watch how much salt and liquids you have.  It will help you to manage your heart failure.    Notes: Keep up the great work!! 11/28/20 Patient weighs daily.  Weight 144 lbs.   Heart Failure Management Discussed Please weight daily or as ordered by your doctor Report to your doctor weight gain of 2 -3 pounds in a day or 5 pounds in a week Limit salt intake Monitor for shortness of breath, swelling of feet, ankles or abdomen and weight gain. Never use the saltshaker.  Read all food labels and avoid canned, processed, and pickled foods.   Follow your doctor's recommendations for daily salt intake.      THN-Track and Manage Heart Rate and Rhythm as evidenced by patient reporting blood pressure and heart rate (pt-stated)   On track     Barriers: Knowledge Timeframe:  Long-Range Goal Priority:  High Start Date:  02/29/20                           Expected End Date:   04/21/21                   Follow Up Date 03/21/21   - check pulse (heart) rate once a day - make a plan to eat healthy - take medicine as prescribed    Why is this important?   Atrial fibrillation may have no symptoms. Sometimes the symptoms get worse or happen more often.  It is important to keep track of what your symptoms are and when they happen.  A change in symptoms is important to discuss with your doctor or nurse.  Being active and healthy eating will also help you manage your heart condition.     Notes: 08/29/20 Continue to monitor heart rate and blood pressure.  11/28/20 Patient checking heart  and blood pressure 172/90 at MD today-home 140/82 heart rate patient reports good. Saw physician today and got 9 months return.  Take medication as prescribed See physicians as scheduled Monitor for chest pain, constant flutters in chest, shortness of breath, weakness, and feeling of passing  out.   Monitor heart rate        Plan:  Follow-up: Patient agrees to Care Plan and Follow-up. Follow-up in 3 month(s)  Jone Baseman, RN, MSN [place] Management Care Management Coordinator Direct Line [phone] Cell [phone] Toll Free: [phone]  Fax: [phone]

## 2020-11-28 NOTE — Patient Instructions (Signed)
Goals Addressed               This Visit's Progress     THN-Track and Manage Fluids and Swelling as evidenced by patient reporting daily weights.   On track     Barriers: None Timeframe:  Long-Range Goal Priority:  High Start Date:    02/29/20                         Expected End Date:       04/21/21               Follow Up Date 03/21/21  - track weight in diary - use salt in moderation - watch for swelling in feet, ankles and legs every day - weigh myself daily    Why is this important?   It is important to check your weight daily and watch how much salt and liquids you have.  It will help you to manage your heart failure.    Notes: Keep up the great work!! 11/28/20 Patient weighs daily.  Weight 144 lbs.   Heart Failure Management Discussed Please weight daily or as ordered by your doctor Report to your doctor weight gain of 2 -3 pounds in a day or 5 pounds in a week Limit salt intake Monitor for shortness of breath, swelling of feet, ankles or abdomen and weight gain. Never use the saltshaker.  Read all food labels and avoid canned, processed, and pickled foods.   Follow your doctor's recommendations for daily salt intake.      THN-Track and Manage Heart Rate and Rhythm as evidenced by patient reporting blood pressure and heart rate (pt-stated)   On track     Barriers: Knowledge Timeframe:  Long-Range Goal Priority:  High Start Date:  02/29/20                           Expected End Date:   04/21/21                   Follow Up Date 03/21/21   - check pulse (heart) rate once a day - make a plan to eat healthy - take medicine as prescribed    Why is this important?   Atrial fibrillation may have no symptoms. Sometimes the symptoms get worse or happen more often.  It is important to keep track of what your symptoms are and when they happen.  A change in symptoms is important to discuss with your doctor or nurse.  Being active and healthy eating will also help you  manage your heart condition.     Notes: 08/29/20 Continue to monitor heart rate and blood pressure.  11/28/20 Patient checking heart and blood pressure 172/90 at MD today-home 140/82 heart rate patient reports good. Saw physician today and got 9 months return.  Take medication as prescribed See physicians as scheduled Monitor for chest pain, constant flutters in chest, shortness of breath, weakness, and feeling of passing out.   Monitor heart rate

## 2020-11-28 NOTE — Patient Instructions (Signed)
Medication Instructions:  Your physician recommends that you continue on your current medications as directed. Please refer to the Current Medication list given to you today.  *If you need a refill on your cardiac medications before your next appointment, please call your pharmacy*   Lab Work: Your physician recommends that you return for lab work in: TODAY TSH, T3, T4, CMP If you have labs (blood work) drawn today and your tests are completely normal, you will receive your results only by: [place] (if you have MyChart) OR A paper copy in the mail If you have any lab test that is abnormal or we need to change your treatment, we will call you to review the results.   Testing/Procedures: We have given you the form to have a chest x-ray completed at [facility]. Please get this done at your earliest convenience.    Follow-Up: At [facility], you and your health needs are our priority.  As part of our continuing mission to provide you with exceptional heart care, we have created designated Provider Care Teams.  These Care Teams include your primary Cardiologist (physician) and Advanced Practice Providers (APPs -  Physician Assistants and Nurse Practitioners) who all work together to provide you with the care you need, when you need it.  We recommend signing up for the patient portal called "MyChart".  Sign up information is provided on this After Visit Summary.  MyChart is used to connect with patients for Virtual Visits (Telemedicine).  Patients are able to view lab/test results, encounter notes, upcoming appointments, etc.  Non-urgent messages can be sent to your provider as well.   To learn more about what you can do with MyChart, go to [email or web address].    Your next appointment:   9 month(s)  The format for your next appointment:   In Person  Provider:   Shirlee More, MD   Other Instructions

## 2020-11-29 ENCOUNTER — Telehealth: Payer: Self-pay

## 2020-11-29 LAB — COMPREHENSIVE METABOLIC PANEL
ALT: 14 IU/L (ref 0–32)
AST: 17 IU/L (ref 0–40)
Albumin/Globulin Ratio: 1.8 (ref 1.2–2.2)
Albumin: 4.1 g/dL (ref 3.7–4.7)
Alkaline Phosphatase: 66 IU/L (ref 44–121)
BUN/Creatinine Ratio: 13 (ref 12–28)
BUN: 17 mg/dL (ref 8–27)
Bilirubin Total: 0.4 mg/dL (ref 0.0–1.2)
CO2: 21 mmol/L (ref 20–29)
Calcium: 9.2 mg/dL (ref 8.7–10.3)
Chloride: 106 mmol/L (ref 96–106)
Creatinine, Ser: 1.27 mg/dL — ABNORMAL HIGH (ref 0.57–1.00)
Globulin, Total: 2.3 g/dL (ref 1.5–4.5)
Glucose: 83 mg/dL (ref 65–99)
Potassium: 4.3 mmol/L (ref 3.5–5.2)
Sodium: 143 mmol/L (ref 134–144)
Total Protein: 6.4 g/dL (ref 6.0–8.5)
eGFR: 43 mL/min/{1.73_m2} — ABNORMAL LOW (ref >59)

## 2020-11-29 LAB — TSH+T4F+T3FREE
Free T4: 1.81 ng/dL — ABNORMAL HIGH (ref 0.82–1.77)
T3, Free: 2.2 pg/mL (ref 2.0–4.4)
TSH: 1.61 u[IU]/mL (ref 0.450–4.500)

## 2020-11-29 NOTE — Telephone Encounter (Signed)
Spoke with patient regarding results and recommendation.  Patient verbalizes understanding and is agreeable to plan of care. Advised patient to call back with any issues or concerns.  

## 2020-11-29 NOTE — Telephone Encounter (Signed)
-----   Message from Richardo Priest, MD sent at 11/29/2020  8:00 AM EDT ----- Normal or stable result  No changes

## 2020-12-14 DIAGNOSIS — I4891 Unspecified atrial fibrillation: Secondary | ICD-10-CM | POA: Diagnosis not present

## 2020-12-14 DIAGNOSIS — Z79899 Other long term (current) drug therapy: Secondary | ICD-10-CM | POA: Diagnosis not present

## 2020-12-14 DIAGNOSIS — J90 Pleural effusion, not elsewhere classified: Secondary | ICD-10-CM | POA: Diagnosis not present

## 2020-12-29 DIAGNOSIS — H25813 Combined forms of age-related cataract, bilateral: Secondary | ICD-10-CM | POA: Diagnosis not present

## 2020-12-29 DIAGNOSIS — H40013 Open angle with borderline findings, low risk, bilateral: Secondary | ICD-10-CM | POA: Diagnosis not present

## 2020-12-29 DIAGNOSIS — H43819 Vitreous degeneration, unspecified eye: Secondary | ICD-10-CM | POA: Diagnosis not present

## 2021-01-12 DIAGNOSIS — Z01818 Encounter for other preprocedural examination: Secondary | ICD-10-CM | POA: Diagnosis not present

## 2021-01-12 DIAGNOSIS — H25811 Combined forms of age-related cataract, right eye: Secondary | ICD-10-CM | POA: Diagnosis not present

## 2021-01-12 DIAGNOSIS — H25813 Combined forms of age-related cataract, bilateral: Secondary | ICD-10-CM | POA: Diagnosis not present

## 2021-01-15 DIAGNOSIS — R61 Generalized hyperhidrosis: Secondary | ICD-10-CM | POA: Diagnosis not present

## 2021-01-15 DIAGNOSIS — Z131 Encounter for screening for diabetes mellitus: Secondary | ICD-10-CM | POA: Diagnosis not present

## 2021-01-15 DIAGNOSIS — I130 Hypertensive heart and chronic kidney disease with heart failure and stage 1 through stage 4 chronic kidney disease, or unspecified chronic kidney disease: Secondary | ICD-10-CM | POA: Diagnosis not present

## 2021-01-15 DIAGNOSIS — N1832 Chronic kidney disease, stage 3b: Secondary | ICD-10-CM | POA: Diagnosis not present

## 2021-01-15 DIAGNOSIS — Z23 Encounter for immunization: Secondary | ICD-10-CM | POA: Diagnosis not present

## 2021-01-15 DIAGNOSIS — Z13220 Encounter for screening for lipoid disorders: Secondary | ICD-10-CM | POA: Diagnosis not present

## 2021-01-15 DIAGNOSIS — M8580 Other specified disorders of bone density and structure, unspecified site: Secondary | ICD-10-CM | POA: Diagnosis not present

## 2021-01-23 DIAGNOSIS — U071 COVID-19: Secondary | ICD-10-CM | POA: Diagnosis not present

## 2021-02-02 ENCOUNTER — Other Ambulatory Visit: Payer: Self-pay | Admitting: Cardiology

## 2021-02-26 ENCOUNTER — Other Ambulatory Visit: Payer: Self-pay

## 2021-02-26 NOTE — Patient Instructions (Signed)
Patient self care Goals: Patient will self administer medications as prescribed Patient will attend all scheduled provider appointments Patient will continue to perform ADL's independently Patient will continue to perform IADL's independently Monitor for shortness of breath, swelling of feet, ankles or abdomen and weight gain.

## 2021-02-26 NOTE — Patient Outreach (Signed)
[place] [facility]) Care Management  [place]  02/26/2021   Elanda Garmany 1940-05-28 [id number]  Subjective: Telephone call to patient for follow up. Patient doing good.  Bout with COVID last month. Reports she is better.  Heart failure management continues.     Objective:   Encounter Medications:  Outpatient Encounter Medications as of 02/26/2021  Medication Sig   amiodarone (PACERONE) 200 MG tablet TAKE 1 TABLET EVERY DAY   Ascorbic Acid (VITAMIN C) 500 MG CAPS Take 500 mg by mouth daily.   cholecalciferol (VITAMIN D3) 25 MCG (1000 UT) tablet Take 1,000 Units by mouth daily.   dapagliflozin propanediol (FARXIGA) 10 MG TABS tablet Take 10 mg by mouth daily.   diltiazem (CARDIZEM) 30 MG tablet TAKE 1 TABLET TWICE DAILY   ELIQUIS 5 MG TABS tablet Take 1 tablet (5 mg total) by mouth 2 (two) times daily.   Multiple Vitamin (MULTIVITAMIN) tablet Take 1 tablet by mouth daily.   Propylene Glycol (SYSTANE BALANCE OP) Apply 1 drop to eye as needed.   No facility-administered encounter medications on file as of 02/26/2021.    Functional Status:  In your present state of health, do you have any difficulty performing the following activities: 08/29/2020 02/29/2020  Hearing? N N  Vision? N N  Difficulty concentrating or making decisions? N N  Walking or climbing stairs? N N  Dressing or bathing? N N  Doing errands, shopping? N N  Preparing Food and eating ? N N  Using the Toilet? N N  In the past six months, have you accidently leaked urine? N N  Do you have problems with loss of bowel control? N N  Managing your Medications? N N  Managing your Finances? N N  Housekeeping or managing your Housekeeping? N N  Some recent data might be hidden    Fall/Depression Screening: Fall Risk  08/29/2020 02/29/2020 08/31/2019  Falls in the past year? 0 0 0  Number falls in past yr: - - -  Injury with Fall? - - -  Risk for fall due to : - - -  Follow up - - -   PHQ 2/9 Scores  08/29/2020 02/29/2020 08/31/2019 02/15/2019 12/24/2018  PHQ - 2 Score 0 0 0 0 0    Assessment:   Care Plan Care Plan : Heart Failure (Adult)  Updates made by Jon Billings, RN since 02/26/2021 12:00 AM     Problem: Symptom Exacerbation (Heart Failure) Resolved 02/26/2021     Long-Range Goal: Symptom Exacerbation Prevented or Minimized as evidenced by no acute exacerbation of heart failure Completed 02/26/2021  Start Date: 02/29/2020  Expected End Date: 04/21/2021  Recent Progress: On track  Priority: High  Note:   Barriers: Knowledge Evidence-based guidance:  Establish a mutually-agreed-upon early intervention process to communicate with primary care provider when signs/symptoms worsen.  Facilitate timely posthospital discharge or emergency department treatment that includes intensive follow-up via telephone calls, home visit, telehealth monitoring and care at multidisciplinary heart failure clinic.  Notes:     Task: Identify and Minimize Risk of Heart Failure Exacerbation Completed 02/26/2021  Due Date: 04/21/2021  Priority: Routine  Responsible User: Jon Billings, RN  Note:   Care Management Activities:    - rescue (action) plan reviewed - self-awareness of signs/symptoms of worsening disease encouraged    Notes: 08/29/20 Patient continues to weigh and watch her diet.  Weight 142.8 lbs 11/28/20 Patient weighs daily.  Weight 144 lbs.   Heart Failure Management Discussed Please weight daily  or as ordered by your doctor Report to your doctor weight gain of 2 -3 pounds in a day or 5 pounds in a week Limit salt intake Monitor for shortness of breath, swelling of feet, ankles or abdomen and weight gain. Never use the saltshaker.  Read all food labels and avoid canned, processed, and pickled foods.   Follow your doctor's recommendations for daily salt intake.    Care Plan : RN Care Manager Plan of Care  Updates made by Jon Billings, RN since 02/26/2021 12:00 AM     Problem:  Chronic Disease  Management and Care Coordination of Heart Failure   Priority: High     Long-Range Goal: Development of Plan of Care for Management of Heart failure   Start Date: 02/26/2021  Expected End Date: 10/19/2021  Priority: High  Note:   Current Barriers:  Knowledge Deficits related to plan of care for management of CHF Chronic Disease Management support and education needs related to CHF  RNCM Clinical Goal(s):  Patient will verbalize understanding of plan for management of CHF verbalize basic understanding of  CHF disease process and self health management plan for heart failure take all medications exactly as prescribed and will call provider for medication related questions continue to work with RN Care Manager to address care management and care coordination needs related to  CHF through collaboration with RN Care manager, provider, and care team.   Interventions: Provided education to patient about basic heart failure disease process Inter-disciplinary care team collaboration (see longitudinal plan of care) Evaluation of current treatment plan related to  self management and patient's adherence to plan as established by provider  Heart Failure Interventions: Provided education on low sodium diet; Advised patient to weigh each morning after emptying bladder; Discussed importance of daily weight and advised patient to weigh and record daily; Report to your doctor weight gain of 2 -3 pounds in a day or 5 pounds in a week  Patient Goals/Self-Care Activities: Patient will self administer medications as prescribed Patient will attend all scheduled provider appointments Patient will continue to perform ADL's independently Patient will continue to perform IADL's independently Monitor for shortness of breath, swelling of feet, ankles or abdomen and weight gain.  Weight daily or as ordered by your doctor Follow Up Plan:  Telephone follow up appointment with care management team  member scheduled for:  February The patient has been provided with contact information for the care management team and has been advised to call with any health related questions or concerns.        Goals Addressed               This Visit's Progress     COMPLETED: THN-Track and Manage Fluids and Swelling as evidenced by patient reporting daily weights.   On track     Barriers: None Timeframe:  Long-Range Goal Priority:  High Start Date:    02/29/20                         Expected End Date:       04/21/21               Follow Up Date 03/21/21  - track weight in diary - use salt in moderation - watch for swelling in feet, ankles and legs every day - weigh myself daily    Why is this important?   It is important to check your weight daily and watch how much salt  and liquids you have.  It will help you to manage your heart failure.    Notes: Keep up the great work!! 11/28/20 Patient weighs daily.  Weight 144 lbs.   Heart Failure Management Discussed Please weight daily or as ordered by your doctor Report to your doctor weight gain of 2 -3 pounds in a day or 5 pounds in a week Limit salt intake Monitor for shortness of breath, swelling of feet, ankles or abdomen and weight gain. Never use the saltshaker.  Read all food labels and avoid canned, processed, and pickled foods.   Follow your doctor's recommendations for daily salt intake. 57/3/22 Duplicate      COMPLETED: THN-Track and Manage Heart Rate and Rhythm as evidenced by patient reporting blood pressure and heart rate (pt-stated)   On track     Barriers: Knowledge Timeframe:  Long-Range Goal Priority:  High Start Date:  02/29/20                           Expected End Date:   04/21/21                   Follow Up Date 03/21/21   - check pulse (heart) rate once a day - make a plan to eat healthy - take medicine as prescribed    Why is this important?   Atrial fibrillation may have no symptoms. Sometimes the  symptoms get worse or happen more often.  It is important to keep track of what your symptoms are and when they happen.  A change in symptoms is important to discuss with your doctor or nurse.  Being active and healthy eating will also help you manage your heart condition.     Notes: 08/29/20 Continue to monitor heart rate and blood pressure.  11/28/20 Patient checking heart and blood pressure 172/90 at MD today-home 140/82 heart rate patient reports good. Saw physician today and got 9 months return.  Take medication as prescribed See physicians as scheduled Monitor for chest pain, constant flutters in chest, shortness of breath, weakness, and feeling of passing out.   Monitor heart rate 05/27/40 Duplicate        Plan:  Follow-up: Patient agrees to Care Plan and Follow-up. Follow-up in 3 month(s)    Jone Baseman, RN, MSN [facility] Care Management Coordinator Direct Line [phone] Cell [phone] Toll Free: [phone]  Fax: [phone]

## 2021-03-24 ENCOUNTER — Other Ambulatory Visit: Payer: Self-pay | Admitting: Cardiology

## 2021-03-30 DIAGNOSIS — H25811 Combined forms of age-related cataract, right eye: Secondary | ICD-10-CM | POA: Diagnosis not present

## 2021-03-30 DIAGNOSIS — Z01818 Encounter for other preprocedural examination: Secondary | ICD-10-CM | POA: Diagnosis not present

## 2021-03-31 ENCOUNTER — Encounter: Payer: Self-pay | Admitting: Cardiology

## 2021-04-03 DIAGNOSIS — Z7902 Long term (current) use of antithrombotics/antiplatelets: Secondary | ICD-10-CM | POA: Diagnosis not present

## 2021-04-03 DIAGNOSIS — I252 Old myocardial infarction: Secondary | ICD-10-CM | POA: Diagnosis not present

## 2021-04-03 DIAGNOSIS — Z87891 Personal history of nicotine dependence: Secondary | ICD-10-CM | POA: Diagnosis not present

## 2021-04-03 DIAGNOSIS — Z7901 Long term (current) use of anticoagulants: Secondary | ICD-10-CM | POA: Diagnosis not present

## 2021-04-03 DIAGNOSIS — I4891 Unspecified atrial fibrillation: Secondary | ICD-10-CM | POA: Diagnosis not present

## 2021-04-03 DIAGNOSIS — H259 Unspecified age-related cataract: Secondary | ICD-10-CM | POA: Diagnosis not present

## 2021-04-03 DIAGNOSIS — H25811 Combined forms of age-related cataract, right eye: Secondary | ICD-10-CM | POA: Diagnosis not present

## 2021-04-10 DIAGNOSIS — E559 Vitamin D deficiency, unspecified: Secondary | ICD-10-CM | POA: Diagnosis not present

## 2021-04-10 DIAGNOSIS — I5040 Unspecified combined systolic (congestive) and diastolic (congestive) heart failure: Secondary | ICD-10-CM | POA: Diagnosis not present

## 2021-04-10 DIAGNOSIS — M8580 Other specified disorders of bone density and structure, unspecified site: Secondary | ICD-10-CM | POA: Diagnosis not present

## 2021-04-10 DIAGNOSIS — Z0000 Encounter for general adult medical examination without abnormal findings: Secondary | ICD-10-CM | POA: Diagnosis not present

## 2021-04-10 DIAGNOSIS — Z1331 Encounter for screening for depression: Secondary | ICD-10-CM | POA: Diagnosis not present

## 2021-04-10 DIAGNOSIS — I480 Paroxysmal atrial fibrillation: Secondary | ICD-10-CM | POA: Diagnosis not present

## 2021-04-10 DIAGNOSIS — Z6826 Body mass index (BMI) 26.0-26.9, adult: Secondary | ICD-10-CM | POA: Diagnosis not present

## 2021-04-10 DIAGNOSIS — I130 Hypertensive heart and chronic kidney disease with heart failure and stage 1 through stage 4 chronic kidney disease, or unspecified chronic kidney disease: Secondary | ICD-10-CM | POA: Diagnosis not present

## 2021-04-10 DIAGNOSIS — N1832 Chronic kidney disease, stage 3b: Secondary | ICD-10-CM | POA: Diagnosis not present

## 2021-04-26 DIAGNOSIS — Z0100 Encounter for examination of eyes and vision without abnormal findings: Secondary | ICD-10-CM | POA: Diagnosis not present

## 2021-05-24 ENCOUNTER — Other Ambulatory Visit: Payer: Self-pay

## 2021-05-24 NOTE — Patient Outreach (Signed)
[place] [facility]) Care Management  05/24/2021  Marcia Leblanc 05/03/1940 [id number]  CMA made telephone outreach to cancel patients upcoming appointment with [place], RN. CMA left confidential voicemail for patient to return call.   Ina Homes [facility] Management Assistant [phone]

## 2021-05-28 ENCOUNTER — Other Ambulatory Visit: Payer: Self-pay

## 2021-05-28 ENCOUNTER — Ambulatory Visit: Payer: Self-pay

## 2021-05-28 NOTE — Patient Outreach (Signed)
[name]) Care Management  05/28/2021  Marcia Leblanc [birth year] [id number]  CMA made second outreach telephone call today to cancel patients upcoming appointment with [place], RN. CMA left confidential voicemail that appointment will be cancelled and rescheduled once RN returns.  [name] [phone]

## 2021-06-05 ENCOUNTER — Encounter: Payer: Self-pay | Admitting: Cardiology

## 2021-06-05 MED ORDER — ELIQUIS 5 MG PO TABS: 5.0000 mg | ORAL_TABLET | Freq: Two times a day (BID) | ORAL | 1 refills | Status: DC

## 2021-06-18 DIAGNOSIS — J069 Acute upper respiratory infection, unspecified: Secondary | ICD-10-CM | POA: Diagnosis not present

## 2021-07-09 DIAGNOSIS — N1832 Chronic kidney disease, stage 3b: Secondary | ICD-10-CM | POA: Diagnosis not present

## 2021-07-11 ENCOUNTER — Other Ambulatory Visit: Payer: Self-pay

## 2021-07-11 NOTE — Patient Outreach (Signed)
[place] [facility]) Care Management ? ?07/11/2021 ? ?Marcia Leblanc ?1940-08-22 ?[id number] ? ? ?Telephone call to patient for disease management follow up.   No answer.  HIPAA compliant voice message left.   ? ?Plan: If no return call, RN CM will attempt patient again in the month of June. ? ?Jone Baseman, RN, MSN ?[facility] Care Management ?Care Management Coordinator ?Direct Line [phone] ?Toll Free: [phone]  ?Fax: [phone] ? ?

## 2021-07-31 DIAGNOSIS — R197 Diarrhea, unspecified: Secondary | ICD-10-CM | POA: Diagnosis not present

## 2021-08-02 DIAGNOSIS — R197 Diarrhea, unspecified: Secondary | ICD-10-CM | POA: Diagnosis not present

## 2021-08-07 DIAGNOSIS — L720 Epidermal cyst: Secondary | ICD-10-CM | POA: Diagnosis not present

## 2021-08-13 DIAGNOSIS — L720 Epidermal cyst: Secondary | ICD-10-CM | POA: Diagnosis not present

## 2021-08-20 DIAGNOSIS — Z1212 Encounter for screening for malignant neoplasm of rectum: Secondary | ICD-10-CM | POA: Diagnosis not present

## 2021-08-20 DIAGNOSIS — Z1211 Encounter for screening for malignant neoplasm of colon: Secondary | ICD-10-CM | POA: Diagnosis not present

## 2021-08-30 LAB — COLOGUARD: COLOGUARD: POSITIVE — AB

## 2021-09-12 ENCOUNTER — Other Ambulatory Visit: Payer: Self-pay

## 2021-09-12 ENCOUNTER — Encounter: Payer: Self-pay | Admitting: Cardiology

## 2021-09-12 ENCOUNTER — Ambulatory Visit: Payer: Medicare HMO | Admitting: Cardiology

## 2021-09-12 VITALS — BP 158/90 | HR 75 | Ht 65.0 in | Wt 148.8 lb

## 2021-09-12 DIAGNOSIS — Z7901 Long term (current) use of anticoagulants: Secondary | ICD-10-CM

## 2021-09-12 DIAGNOSIS — I110 Hypertensive heart disease with heart failure: Secondary | ICD-10-CM | POA: Diagnosis not present

## 2021-09-12 DIAGNOSIS — I340 Nonrheumatic mitral (valve) insufficiency: Secondary | ICD-10-CM | POA: Diagnosis not present

## 2021-09-12 DIAGNOSIS — Z79899 Other long term (current) drug therapy: Secondary | ICD-10-CM | POA: Diagnosis not present

## 2021-09-12 DIAGNOSIS — I484 Atypical atrial flutter: Secondary | ICD-10-CM | POA: Diagnosis not present

## 2021-09-12 MED ORDER — ELIQUIS 5 MG PO TABS: 5.0000 mg | ORAL_TABLET | Freq: Two times a day (BID) | ORAL | 3 refills | Status: DC

## 2021-09-12 NOTE — Patient Instructions (Signed)
Medication Instructions:  Your physician recommends that you continue on your current medications as directed. Please refer to the Current Medication list given to you today. *If you need a refill on your cardiac medications before your next appointment, please call your pharmacy*   Lab Work: Your physician recommends that you return for lab work in:   Labs today: CBC, CMP, TSH Free T3 T4  If you have labs (blood work) drawn today and your tests are completely normal, you will receive your results only by: [place] (if you have [place]) OR A paper copy in the mail If you have any lab test that is abnormal or we need to change your treatment, we will call you to review the results.   Testing/Procedures: None   Follow-Up: At [facility], you and your health needs are our priority.  As part of our continuing mission to provide you with exceptional heart care, we have created designated Provider Care Teams.  These Care Teams include your primary Cardiologist (physician) and Advanced Practice Providers (APPs -  Physician Assistants and Nurse Practitioners) who all work together to provide you with the care you need, when you need it.  We recommend signing up for the patient portal called "MyChart".  Sign up information is provided on this After Visit Summary.  MyChart is used to connect with patients for Virtual Visits (Telemedicine).  Patients are able to view lab/test results, encounter notes, upcoming appointments, etc.  Non-urgent messages can be sent to your provider as well.   To learn more about what you can do with MyChart, go to [email or web address].    Your next appointment:   9 month(s)  The format for your next appointment:   In Person  Provider:   Shirlee More, MD    Other Instructions None  Important Information About Sugar

## 2021-09-12 NOTE — Progress Notes (Signed)
Cardiology Office Note:    Date:  09/12/2021   ID:  Marcia Leblanc, DOB November 17, 1940, MRN [id number]  PCP:  Townsend Roger, MD  Cardiologist:  Shirlee More, MD    Referring MD: Townsend Roger, MD    ASSESSMENT:    1. Atypical atrial flutter ([place])   2. On amiodarone therapy   3. Chronic anticoagulation   4. Hypertensive heart disease with heart failure ([place])   5. Nonrheumatic mitral valve regurgitation    PLAN:    In order of problems listed above:  She continues to do well maintaining sinus rhythm on low-dose amiodarone we will recheck CMP and thyroid no apparent toxicity and continue her current anticoagulant. Stable BP at target heart failure compensated no longer on loop diuretic she will continue SGLT2 inhibitor and a rate limiting calcium channel blocker Stable she had improvement in her functional MR treating heart failure and resuming sinus rhythm consider repeat echocardiogram next visit   Next appointment: 9 months   Medication Adjustments/Labs and Tests Ordered: Current medicines are reviewed at length with the patient today.  Concerns regarding medicines are outlined above.  No orders of the defined types were placed in this encounter.  No orders of the defined types were placed in this encounter.   Chief Complaint  Patient presents with   Follow-up   Atrial Fibrillation   Anticoagulation   Congestive Heart Failure   Mitral Regurgitation    History of Present Illness:    Marcia Leblanc is a 81 y.o. female with a hx of paroxysmal atrial fibrillation and flutter maintaining sinus rhythm on low-dose amiodarone chronic anticoagulation chronic combined systolic and diastolic heart failure hypertensive heart disease and mitral regurgitation last seen 0879 2022 follow-up.  Initially she had severe left ventricular dysfunction.Echocardiogram 05/26/2019 showed her ejection fraction normalized 60 to 65% and mitral regurgitation was moderate.  Compliance with diet,  lifestyle and medications: Yes  It is always a pleasure to see her in the office She enjoys life very vigorous and active She has had no edema orthopnea shortness of breath chest pain palpitation or syncope Tolerates amiodarone without toxicity and her anticoagulant without bleeding She remains in sinus rhythm today Most recent labs 07/09/2021 creatinine 1.27 potassium 4.1 Past Medical History:  Diagnosis Date   Arrhythmia    Arthritis    Atrial flutter (HCC)    CHF (congestive heart failure) (HCC)    Chronic anticoagulation 5/63/8756   Diastolic heart failure (HCC)    Dyspnea on exertion    Mitral regurgitation    Status post total replacement of left hip 09/23/2014    Past Surgical History:  Procedure Laterality Date   CARPAL TUNNEL RELEASE Bilateral 68   JOINT REPLACEMENT Right 2012   hip   KNEE ARTHROSCOPY Right    torn cartilage   TOTAL HIP ARTHROPLASTY Left 09/23/2014   Procedure: TOTAL HIP ARTHROPLASTY ANTERIOR APPROACH;  Surgeon: Marybelle Killings, MD;  Location: [place];  Service: Orthopedics;  Laterality: Left;   TUBAL LIGATION  68    Current Medications: Current Meds  Medication Sig   amiodarone (PACERONE) 200 MG tablet TAKE 1 TABLET EVERY DAY   Ascorbic Acid (VITAMIN C) 500 MG CAPS Take 500 mg by mouth daily.   cholecalciferol (VITAMIN D3) 25 MCG (1000 UT) tablet Take 1,000 Units by mouth daily.   dapagliflozin propanediol (FARXIGA) 10 MG TABS tablet Take 10 mg by mouth daily.   diltiazem (CARDIZEM) 30 MG tablet TAKE 1 TABLET TWICE DAILY  Multiple Vitamin (MULTIVITAMIN) tablet Take 1 tablet by mouth daily.   Omega-3 Fatty Acids (FISH OIL) 1000 MG CAPS Take 1 capsule by mouth daily.   Propylene Glycol (SYSTANE BALANCE OP) Apply 1 drop to eye as needed.   [DISCONTINUED] ELIQUIS 5 MG TABS tablet Take 1 tablet (5 mg total) by mouth 2 (two) times daily.     Allergies:   Patient has no known allergies.   Social History   Socioeconomic History   Marital status: Married     Spouse name: Marcia Leblanc   Number of children: 2   Years of education: Not on file   Highest education level: Not on file  Occupational History   Not on file  Tobacco Use   Smoking status: Former    Packs/day: 0.50    Years: 50.00    Pack years: 25.00    Types: Cigarettes    Quit date: 09/11/2012    Years since quitting: 9.0    Passive exposure: Past   Smokeless tobacco: Never  Vaping Use   Vaping Use: Never used  Substance and Sexual Activity   Alcohol use: Not Currently   Drug use: No   Sexual activity: Yes  Other Topics Concern   Not on file  Social History Narrative   Pt has some stress recently due to new medical dxs and learning how to self manage. She is coping well and following MD orders.   Social Determinants of Health   Financial Resource Strain: Not on file  Food Insecurity: Not on file  Transportation Needs: Not on file  Physical Activity: Not on file  Stress: Not on file  Social Connections: Not on file     Family History: The patient's family history includes Alzheimer's disease in her brother; Cancer in her father; Dementia in her brother; Heart attack in her brother; Osteoporosis in her mother. There is no history of Stroke. ROS:   Please see the history of present illness.    All other systems reviewed and are negative.  EKGs/Labs/Other Studies Reviewed:    The following studies were reviewed today:  EKG:  EKG ordered today and personally reviewed.  The ekg ordered today demonstrates sinus rhythm right bundle branch block minor ST abnormality normal QT interval  Recent Labs: 11/28/2020: ALT 14; BUN 17; Creatinine, Ser 1.27; Potassium 4.3; Sodium 143; TSH 1.610  Recent Lipid Panel No results found for: CHOL, TRIG, HDL, CHOLHDL, VLDL, LDLCALC, LDLDIRECT  Physical Exam:    VS:  BP (!) 158/90 (BP Location: Right Arm, Patient Position: Sitting)   Pulse 75   Ht '5\' 5"'$  (1.651 m)   Wt 148 lb 12.8 oz (67.5 kg)   SpO2 96%   BMI 24.76 kg/m     Wt  Readings from Last 3 Encounters:  09/12/21 148 lb 12.8 oz (67.5 kg)  11/28/20 146 lb 3.2 oz (66.3 kg)  05/11/20 151 lb 12.8 oz (68.9 kg)     GEN: She appears her age well nourished, well developed in no acute distress HEENT: Normal NECK: No JVD; No carotid bruits LYMPHATICS: No lymphadenopathy CARDIAC: 1/6 murmur of MR RRR, no rubs, gallops RESPIRATORY:  Clear to auscultation without rales, wheezing or rhonchi  ABDOMEN: Soft, non-tender, non-distended MUSCULOSKELETAL:  No edema; No deformity  SKIN: Warm and dry NEUROLOGIC:  Alert and oriented x 3 PSYCHIATRIC:  Normal affect    Signed, Shirlee More, MD  09/12/2021 3:35 PM    [facility]

## 2021-09-12 NOTE — Telephone Encounter (Signed)
Patient assistance form completed and new rx given to patient to mail form for Eliquis assitance

## 2021-09-13 LAB — COMPREHENSIVE METABOLIC PANEL
ALT: 14 IU/L (ref 0–32)
AST: 18 IU/L (ref 0–40)
Albumin/Globulin Ratio: 1.6 (ref 1.2–2.2)
Albumin: 4.1 g/dL (ref 3.7–4.7)
Alkaline Phosphatase: 62 IU/L (ref 44–121)
BUN/Creatinine Ratio: 13 (ref 12–28)
BUN: 15 mg/dL (ref 8–27)
Bilirubin Total: 0.4 mg/dL (ref 0.0–1.2)
CO2: 22 mmol/L (ref 20–29)
Calcium: 9.9 mg/dL (ref 8.7–10.3)
Chloride: 106 mmol/L (ref 96–106)
Creatinine, Ser: 1.12 mg/dL — ABNORMAL HIGH (ref 0.57–1.00)
Globulin, Total: 2.5 g/dL (ref 1.5–4.5)
Glucose: 95 mg/dL (ref 70–99)
Potassium: 4.2 mmol/L (ref 3.5–5.2)
Sodium: 141 mmol/L (ref 134–144)
Total Protein: 6.6 g/dL (ref 6.0–8.5)
eGFR: 50 mL/min/{1.73_m2} — ABNORMAL LOW (ref >59)

## 2021-09-13 LAB — CBC
Hematocrit: 44.3 % (ref 34.0–46.6)
Hemoglobin: 14.9 g/dL (ref 11.1–15.9)
MCH: 30.7 pg (ref 26.6–33.0)
MCHC: 33.6 g/dL (ref 31.5–35.7)
MCV: 91 fL (ref 79–97)
Platelets: 153 10*3/uL (ref 150–450)
RBC: 4.86 x10E6/uL (ref 3.77–5.28)
RDW: 13.6 % (ref 11.7–15.4)
WBC: 5.1 10*3/uL (ref 3.4–10.8)

## 2021-09-13 LAB — TSH+T4F+T3FREE
Free T4: 1.66 ng/dL (ref 0.82–1.77)
T3, Free: 2 pg/mL (ref 2.0–4.4)
TSH: 1.09 u[IU]/mL (ref 0.450–4.500)

## 2021-10-02 ENCOUNTER — Telehealth: Payer: Self-pay | Admitting: *Deleted

## 2021-10-02 DIAGNOSIS — N289 Disorder of kidney and ureter, unspecified: Secondary | ICD-10-CM | POA: Diagnosis not present

## 2021-10-02 DIAGNOSIS — R195 Other fecal abnormalities: Secondary | ICD-10-CM | POA: Diagnosis not present

## 2021-10-02 NOTE — Telephone Encounter (Signed)
    [facility] Pre-operative Risk Assessment    Request for surgical clearance:  What type of surgery is being performed? Colonoscopy   When is this surgery scheduled? 10/31/21   What type of clearance is required (medical clearance vs. Pharmacy clearance to hold med vs. Both)? Pharmacy Clearance  Are there any medications that need to be held prior to surgery and how long?Eliquis 5 mg bid   Practice name and name of physician performing surgery? [facility] Dr. Kyra Leyland, MD   What is your office phone number [phone]    7.   What is your office fax number [phone]  8.   Anesthesia type (None, local, MAC, general) ? Not Specified   Marcia Leblanc 10/02/2021, 3:39 PM  _________________________________________________________________   (provider comments below)

## 2021-10-03 ENCOUNTER — Other Ambulatory Visit: Payer: Self-pay

## 2021-10-03 ENCOUNTER — Other Ambulatory Visit: Payer: Self-pay | Admitting: Cardiology

## 2021-10-03 NOTE — Patient Outreach (Signed)
Marcia Leblanc) Care Management  10/03/2021  Marcia Leblanc [birth year] [id number]   Patient doing well. Patient meeting goals.  Discussed with patient graduating from [facility] program. Patient agreeable. No concerns.   Care Plan : Heart Failure (Adult)  Updates made by Jon Billings, RN since 10/03/2021 12:00 AM  Completed 10/03/2021   Care Plan : RN Care Manager Plan of Care  Updates made by Jon Billings, RN since 10/03/2021 12:00 AM  Completed 10/03/2021   Problem: Chronic Disease  Management and Care Coordination of Heart Failure Resolved 10/03/2021  Priority: High  Note:   Goals met 10/03/21    Long-Range Goal: Development of Plan of Care for Management of Heart failure Completed 10/03/2021  Start Date: 02/26/2021  Expected End Date: 10/19/2021  This Visit's Progress: On track  Priority: High  Note:   Current Barriers:  Knowledge Deficits related to plan of care for management of CHF Chronic Disease Management support and education needs related to CHF  RNCM Clinical Goal(s):  Patient will verbalize understanding of plan for management of CHF verbalize basic understanding of  CHF disease process and self health management plan for heart failure take all medications exactly as prescribed and will call provider for medication related questions continue to work with RN Care Manager to address care management and care coordination needs related to  CHF through collaboration with RN Care manager, provider, and care team.   Interventions: Provided education to patient about basic heart failure disease process Inter-disciplinary care team collaboration (see longitudinal plan of care) Evaluation of current treatment plan related to  self management and patient's adherence to plan as established by provider  Heart Failure Interventions: Provided education on low sodium diet; Advised patient to weigh each morning after emptying bladder; Discussed importance of daily weight and  advised patient to weigh and record daily; Report to your doctor weight gain of 2 -3 pounds in a day or 5 pounds in a week  Patient Goals/Self-Care Activities: Patient will self administer medications as prescribed Patient will attend all scheduled provider appointments Patient will continue to perform ADL's independently Patient will continue to perform IADL's independently Monitor for shortness of breath, swelling of feet, ankles or abdomen and weight gain.  Weight daily or as ordered by your doctor  10/03/21 Patient doing well.  Patient meeting goals.  Case closure call.    Follow Up Plan:  Telephone follow up appointment with care management team member scheduled for:  February The patient has been provided with contact information for the care management team and has been advised to call with any health related questions or concerns.      Plan: RN CM closing case.    Marcia Baseman, RN, MSN [facility] Care Management Care Management Coordinator Direct Line [phone] Toll Free: [phone]  Fax: [phone]

## 2021-10-03 NOTE — Telephone Encounter (Signed)
Pt last saw Dr Bettina Gavia 09/12/21, last labs 09/12/21 Creat 1.12, age 81, weight 67.5kg,based on specified criteria pt is on appropriate dosage of Eliquis '5mg'$  BID for aflutter.  Will refill rx.

## 2021-10-03 NOTE — Telephone Encounter (Signed)
   Primary Cardiologist: None  Chart reviewed as part of pre-operative protocol coverage. Given past medical history and time since last visit, based on ACC/AHA guidelines, [place] would be at acceptable risk for the planned procedure without further cardiovascular testing.   Per office protocol, she may hold Eliquis for 2 days prior to procedure.  She should resume as soon as hemodynamically stable.  I will route this recommendation to the requesting party via Epic fax function and remove from pre-op pool.  Please call with questions.  Emmaline Life, NP-C    10/03/2021, 4:59 PM [place] [address] Office [phone] Fax [phone]

## 2021-10-03 NOTE — Telephone Encounter (Signed)
Patient with diagnosis of Afib on Eliquis for anticoagulation.    Procedure: Colonoscopy Date of procedure: 10/31/21    CHA2DS2-VASc Score = 5  This indicates a 7.2% annual risk of stroke. The patient's score is based upon: CHF History: 1 HTN History: 1 Diabetes History: 0 Stroke History: 0 Vascular Disease History: 0 Age Score: 2 Gender Score: 1   CrCl 43 ml/min Platelet count 153  Per office protocol, patient can hold Eliquis for 2 days prior to procedure.

## 2021-10-25 DIAGNOSIS — M8580 Other specified disorders of bone density and structure, unspecified site: Secondary | ICD-10-CM | POA: Diagnosis not present

## 2021-10-25 DIAGNOSIS — N1832 Chronic kidney disease, stage 3b: Secondary | ICD-10-CM | POA: Diagnosis not present

## 2021-10-25 DIAGNOSIS — R195 Other fecal abnormalities: Secondary | ICD-10-CM | POA: Diagnosis not present

## 2021-10-31 DIAGNOSIS — R195 Other fecal abnormalities: Secondary | ICD-10-CM | POA: Diagnosis not present

## 2021-10-31 DIAGNOSIS — Z79899 Other long term (current) drug therapy: Secondary | ICD-10-CM | POA: Diagnosis not present

## 2021-10-31 DIAGNOSIS — K6389 Other specified diseases of intestine: Secondary | ICD-10-CM | POA: Diagnosis not present

## 2021-10-31 DIAGNOSIS — Z7901 Long term (current) use of anticoagulants: Secondary | ICD-10-CM | POA: Diagnosis not present

## 2021-10-31 DIAGNOSIS — K635 Polyp of colon: Secondary | ICD-10-CM | POA: Diagnosis not present

## 2021-10-31 DIAGNOSIS — K644 Residual hemorrhoidal skin tags: Secondary | ICD-10-CM | POA: Diagnosis not present

## 2021-10-31 DIAGNOSIS — K5790 Diverticulosis of intestine, part unspecified, without perforation or abscess without bleeding: Secondary | ICD-10-CM | POA: Diagnosis not present

## 2021-10-31 DIAGNOSIS — K5730 Diverticulosis of large intestine without perforation or abscess without bleeding: Secondary | ICD-10-CM | POA: Diagnosis not present

## 2021-10-31 DIAGNOSIS — Z1211 Encounter for screening for malignant neoplasm of colon: Secondary | ICD-10-CM | POA: Diagnosis not present

## 2021-11-16 ENCOUNTER — Other Ambulatory Visit: Payer: Self-pay | Admitting: Cardiology

## 2021-11-16 DIAGNOSIS — I484 Atypical atrial flutter: Secondary | ICD-10-CM

## 2021-11-16 NOTE — Telephone Encounter (Signed)
Prescription refill request for Eliquis received. Indication: Aflutter Last office visit:09/12/21 [facility]) Scr: 1.12 (09/12/21) Age: 81 Weight: 67.5kg  Appropriate dose and refill sent to requested pharmacy.

## 2021-12-05 ENCOUNTER — Telehealth (HOSPITAL_BASED_OUTPATIENT_CLINIC_OR_DEPARTMENT_OTHER): Payer: Self-pay

## 2021-12-05 NOTE — Telephone Encounter (Signed)
Notified patient we received letter of approval from [place] [facility] stating patient was approved for Eliquis at no cost to patient. Approved  12/05/21 through 04/21/22. Patient voiced appreciation and thanked me for calling. Copy of letter scanned to patients chart

## 2021-12-06 DIAGNOSIS — K644 Residual hemorrhoidal skin tags: Secondary | ICD-10-CM | POA: Diagnosis not present

## 2021-12-06 DIAGNOSIS — K52832 Lymphocytic colitis: Secondary | ICD-10-CM | POA: Diagnosis not present

## 2021-12-06 DIAGNOSIS — K5790 Diverticulosis of intestine, part unspecified, without perforation or abscess without bleeding: Secondary | ICD-10-CM | POA: Diagnosis not present

## 2021-12-11 ENCOUNTER — Encounter: Payer: Self-pay | Admitting: Cardiology

## 2021-12-28 DIAGNOSIS — R1013 Epigastric pain: Secondary | ICD-10-CM | POA: Diagnosis not present

## 2021-12-28 DIAGNOSIS — R10814 Left lower quadrant abdominal tenderness: Secondary | ICD-10-CM | POA: Diagnosis not present

## 2022-01-04 DIAGNOSIS — R10814 Left lower quadrant abdominal tenderness: Secondary | ICD-10-CM | POA: Diagnosis not present

## 2022-01-04 DIAGNOSIS — R1013 Epigastric pain: Secondary | ICD-10-CM | POA: Diagnosis not present

## 2022-01-04 DIAGNOSIS — K649 Unspecified hemorrhoids: Secondary | ICD-10-CM | POA: Diagnosis not present

## 2022-01-21 DIAGNOSIS — J069 Acute upper respiratory infection, unspecified: Secondary | ICD-10-CM | POA: Diagnosis not present

## 2022-02-09 ENCOUNTER — Other Ambulatory Visit: Payer: Self-pay | Admitting: Cardiology

## 2022-03-01 DIAGNOSIS — Z23 Encounter for immunization: Secondary | ICD-10-CM | POA: Diagnosis not present

## 2022-03-01 DIAGNOSIS — I110 Hypertensive heart disease with heart failure: Secondary | ICD-10-CM | POA: Diagnosis not present

## 2022-03-01 DIAGNOSIS — M8580 Other specified disorders of bone density and structure, unspecified site: Secondary | ICD-10-CM | POA: Diagnosis not present

## 2022-03-01 DIAGNOSIS — N1832 Chronic kidney disease, stage 3b: Secondary | ICD-10-CM | POA: Diagnosis not present

## 2022-03-01 DIAGNOSIS — K52832 Lymphocytic colitis: Secondary | ICD-10-CM | POA: Diagnosis not present

## 2022-03-13 ENCOUNTER — Ambulatory Visit (INDEPENDENT_AMBULATORY_CARE_PROVIDER_SITE_OTHER): Payer: Medicare HMO | Admitting: Internal Medicine

## 2022-03-13 VITALS — BP 126/76 | HR 74 | Temp 97.6°F | Resp 18 | Ht 62.0 in | Wt 139.8 lb

## 2022-03-13 DIAGNOSIS — K529 Noninfective gastroenteritis and colitis, unspecified: Secondary | ICD-10-CM

## 2022-03-13 HISTORY — DX: Noninfective gastroenteritis and colitis, unspecified: K52.9

## 2022-03-13 NOTE — Patient Instructions (Signed)
I will refer her to see Dr. Lyndel Safe for lymphocytic colitis.

## 2022-03-13 NOTE — Progress Notes (Signed)
   Established Patient Office Visit  Subjective   Patient ID: Marcia Leblanc, female    DOB: [birth year]  Age: 81 y.o. MRN: [id number]  Chief Complaint  Patient presents with   Abdominal Pain    IBS    HPI 81 years old female who is here to discuss IBS and colitis due to inflammation.  She received few doses of steroid that partially helped and also received 2 courses of antibiotic for colitis.  She was supposed to see gastroenterologist Dr. Lyda Jester here but she does not wanted to see him.  As her symptoms are not controlled and she is still having diarrhea I have recommended to see Dr. Lyndel Safe that he like.  No fever or chills.  She is agreeable to see Dr. Lyndel Safe for further evaluation of inflammatory colitis.  She does has abdominal pain and diarrhea.  She has a 2-3 loose bowel movement in the morning.  There is no blood in it.   Review of Systems  Constitutional: Negative.   Cardiovascular: Negative.   Gastrointestinal:  Positive for abdominal pain and diarrhea. Negative for blood in stool.  Genitourinary: Negative.       Objective:     BP 126/76 (BP Location: Left Arm, Patient Position: Sitting, Cuff Size: Normal)   Pulse 74   Temp 97.6 F (36.4 C) (Temporal)   Resp 18   Ht '5\' 2"'$  (1.575 m)   Wt 139 lb 12.8 oz (63.4 kg)   SpO2 96%   BMI 25.57 kg/m    Physical Exam HENT:     Head: Normocephalic and atraumatic.  Cardiovascular:     Rate and Rhythm: Normal rate and regular rhythm.     Heart sounds: Normal heart sounds.  Abdominal:     General: Abdomen is flat. Bowel sounds are normal.     Palpations: Abdomen is soft.     Tenderness: There is abdominal tenderness.  Neurological:     General: No focal deficit present.     Mental Status: She is alert and oriented to person, place, and time.      No results found for any visits on 03/13/22.   The ASCVD Risk score (Arnett DK, et al., 2019) failed to calculate for the following reasons:   The patient has a prior MI  or stroke diagnosis    Assessment & Plan:   Problem List Items Addressed This Visit   None   No follow-ups on file.    Garwin Brothers, MD

## 2022-03-16 ENCOUNTER — Encounter: Payer: Self-pay | Admitting: Internal Medicine

## 2022-04-03 ENCOUNTER — Telehealth: Payer: Self-pay

## 2022-04-03 NOTE — Telephone Encounter (Signed)
Left voicemail regarding patients Eliquis assistance from [facility]. Patient will need to complete a renewal application before 00/16/4290. Instructed patient to stop by office for a form or she is able to print one from the [employer] and if she had any question to call office.

## 2022-04-24 ENCOUNTER — Telehealth: Payer: Self-pay

## 2022-04-24 DIAGNOSIS — I484 Atypical atrial flutter: Secondary | ICD-10-CM

## 2022-04-24 MED ORDER — ELIQUIS 5 MG PO TABS: 5.0000 mg | ORAL_TABLET | Freq: Two times a day (BID) | ORAL | 3 refills | Status: DC

## 2022-04-24 NOTE — Telephone Encounter (Signed)
Application faxed

## 2022-04-24 NOTE — Telephone Encounter (Signed)
Patient returned [facility] patient assistance application for Eliquis. Rx printed and provider portion completed forward to provider for signature.

## 2022-05-18 ENCOUNTER — Other Ambulatory Visit: Payer: Self-pay | Admitting: Cardiology

## 2022-05-18 NOTE — Progress Notes (Unsigned)
Cardiology Office Note:    Date:  05/20/2022   ID:  Marcia Leblanc, DOB [birth year], MRN [id number]  PCP:  Garwin Brothers, MD  Cardiologist:  Shirlee More, MD    Referring MD: Townsend Roger, MD    ASSESSMENT:    1. Atypical atrial flutter ([name])   2. On amiodarone therapy   3. Chronic anticoagulation   4. Hypertensive heart disease with heart failure ([name])   5. Nonrheumatic mitral valve regurgitation    PLAN:    In order of problems listed above:  Marcia Leblanc has Leblanc very well maintaining sinus rhythm and tolerating low-dose amiodarone and her anticoagulant and continue the same Check thyroid liver regarding toxicity continued surveillance of amiodarone She will continue her current anticoagulant Her ejection fraction normalized with resumption of sinus rhythm not on a loop diuretic continue her rate limiting calcium channel blocker and we will plan to recheck an echocardiogram for both ejection fraction as well as mitral regurgitation previously moderate   Next appointment: 6 months   Medication Adjustments/Labs and Tests Ordered: Current medicines are reviewed at length with the patient today.  Concerns regarding medicines are outlined above.  No orders of the defined types were placed in this encounter.  No orders of the defined types were placed in this encounter.   Chief Complaint  Patient presents with   Follow-up   Atrial Fibrillation   Hypertension   Congestive Heart Failure    History of Present Illness:    Marcia Leblanc is a 82 y.o. female with a hx of atrial fibrillation and flutter maintaining sinus rhythm on low-dose amiodarone and with chronic anticoagulation hypertensive heart disease with heart failure and mitral regurgitation last seen 09/12/2021. Initially in the setting of atrial fibrillation she had severe left ventricular dysfunction.Echocardiogram 05/26/2019 showed her ejection fraction normalized 60 to 65% and mitral regurgitation was  moderate  Compliance with diet, lifestyle and medications: Yes  Is notably doing well when she is doing better.  [facility] and [place] was 30 miles a week on the treadmill. She has had no recurrence of atrial fibrillation tolerates the amiodarone without side effects and her anticoagulant without bleeding No edema shortness of breath orthopnea chest pain or syncope Past Medical History:  Diagnosis Date   Arrhythmia    Arthritis    Atrial flutter (HCC)    CHF (congestive heart failure) (HCC)    Chronic anticoagulation 1/95/0932   Diastolic heart failure (HCC)    Dyspnea on exertion    Mitral regurgitation    Status post total replacement of left hip 09/23/2014    Past Surgical History:  Procedure Laterality Date   CARPAL TUNNEL RELEASE Bilateral 68   JOINT REPLACEMENT Right 2012   hip   KNEE ARTHROSCOPY Right    torn cartilage   TOTAL HIP ARTHROPLASTY Left 09/23/2014   Procedure: TOTAL HIP ARTHROPLASTY ANTERIOR APPROACH;  Surgeon: Marybelle Killings, MD;  Location: [facility];  Service: Orthopedics;  Laterality: Left;   TUBAL LIGATION  68    Current Medications: Current Meds  Medication Sig   amiodarone (PACERONE) 200 MG tablet TAKE 1 TABLET EVERY DAY   Ascorbic Acid (VITAMIN C) 500 MG CAPS Take 500 mg by mouth daily.   Chlorpheniramine-Acetaminophen (CORICIDIN HBP COLD/FLU PO) Take 1 tablet by mouth at bedtime as needed (COLD / FLU).   Cholecalciferol (VITAMIN D) 125 MCG (5000 UT) CAPS Take 5,000 Units by mouth daily.   dapagliflozin propanediol (FARXIGA) 10 MG TABS tablet Take  10 mg by mouth daily.   diltiazem (CARDIZEM) 30 MG tablet Take 1 tablet (30 mg total) by mouth 2 (two) times daily.   ELIQUIS 5 MG TABS tablet Take 1 tablet (5 mg total) by mouth 2 (two) times daily.   Multiple Vitamin (MULTIVITAMIN) tablet Take 1 tablet by mouth daily.   Nutritional Supplements (IBS SUPPORT PO) Take 1 tablet by mouth 2 (two) times daily. IBS CLEAR   Omega-3 Fatty Acids (FISH OIL) 1000 MG  CAPS Take 1 capsule by mouth daily.   Probiotic Product (PROBIOTIC DAILY PO) Take 1 capsule by mouth daily.   Propylene Glycol (SYSTANE BALANCE OP) Apply 1 drop to eye as needed.     Allergies:   Patient has no known allergies.   Social History   Socioeconomic History   Marital status: Married    Spouse name: Barnabas Lister   Number of children: 2   Years of education: Not on file   Highest education level: Not on file  Occupational History   Not on file  Tobacco Use   Smoking status: Former    Packs/day: 0.50    Years: 50.00    Total pack years: 25.00    Types: Cigarettes    Quit date: 09/11/2012    Years since quitting: 9.6    Passive exposure: Past   Smokeless tobacco: Never  Vaping Use   Vaping Use: Never used  Substance and Sexual Activity   Alcohol use: Not Currently   Drug use: No   Sexual activity: Yes  Other Topics Concern   Not on file  Social History Narrative   Pt has some stress recently due to new medical dxs and learning how to self manage. She is coping well and following MD orders.   Social Determinants of Health   Financial Resource Strain: Low Risk  (12/24/2018)   Overall Financial Resource Strain (CARDIA)    Difficulty of Paying Living Expenses: Not hard at all  Food Insecurity: No Food Insecurity (12/24/2018)   Hunger Vital Sign    Worried About Running Out of Food in the Last Year: Never true    [place] in the Last Year: Never true  Transportation Needs: No Transportation Needs (08/29/2020)   PRAPARE - Hydrologist (Medical): No    Lack of Transportation (Non-Medical): No  Physical Activity: Not on file  Stress: No Stress Concern Present (12/24/2018)   [place]    Feeling of Stress : Only a little  Social Connections: Socially Integrated (12/24/2018)   Social Connection and Isolation Panel [NHANES]    Frequency of Communication with Friends and Family:  More than three times a week    Frequency of Social Gatherings with Friends and Family: More than three times a week    Attends Religious Services: More than 4 times per year    Active Member of [employer] or Organizations: Yes    Attends Archivist Meetings: 1 to 4 times per year    Marital Status: Married     Family History: The patient's family history includes Alzheimer's disease in her brother; Cancer in her father; Dementia in her brother; Heart attack in her brother; Osteoporosis in her mother. There is no history of Stroke. ROS:   Please see the history of present illness.    All other systems reviewed and are negative.  EKGs/Labs/Other Studies Reviewed:    The following studies were reviewed today:  EKG:  EKG ordered today and personally reviewed.  The ekg ordered today demonstrates sinus rhythm left and right atrial enlargement 1 APC incomplete right bundle branch block  Recent Labs: 09/12/2021: ALT 14; BUN 15; Creatinine, Ser 1.12; Hemoglobin 14.9; Platelets 153; Potassium 4.2; Sodium 141; TSH 1.090    Physical Exam:    VS:  BP (!) 148/88 (BP Location: Right Arm, Patient Position: Sitting)   Pulse 69   Ht '5\' 2"'$  (1.575 m)   Wt 136 lb 9.6 oz (62 kg)   SpO2 97%   BMI 24.98 kg/m     Wt Readings from Last 3 Encounters:  05/20/22 136 lb 9.6 oz (62 kg)  03/13/22 139 lb 12.8 oz (63.4 kg)  09/12/21 148 lb 12.8 oz (67.5 kg)     GEN:  Well nourished, well developed in no acute distress HEENT: Normal NECK: No JVD; No carotid bruits LYMPHATICS: No lymphadenopathy CARDIAC: 1 of 6 MR RRR, no murmurs, rubs, gallops RESPIRATORY:  Clear to auscultation without rales, wheezing or rhonchi  ABDOMEN: Soft, non-tender, non-distended MUSCULOSKELETAL:  No edema; No deformity  SKIN: Warm and dry NEUROLOGIC:  Alert and oriented x 3 PSYCHIATRIC:  Normal affect    Signed, Shirlee More, MD  05/20/2022 9:27 AM    [place]

## 2022-05-20 ENCOUNTER — Ambulatory Visit: Payer: Medicare HMO | Attending: Cardiology | Admitting: Cardiology

## 2022-05-20 ENCOUNTER — Encounter: Payer: Self-pay | Admitting: Cardiology

## 2022-05-20 VITALS — BP 148/88 | HR 69 | Ht 62.0 in | Wt 136.6 lb

## 2022-05-20 DIAGNOSIS — Z7901 Long term (current) use of anticoagulants: Secondary | ICD-10-CM

## 2022-05-20 DIAGNOSIS — I484 Atypical atrial flutter: Secondary | ICD-10-CM | POA: Diagnosis not present

## 2022-05-20 DIAGNOSIS — I110 Hypertensive heart disease with heart failure: Secondary | ICD-10-CM

## 2022-05-20 DIAGNOSIS — Z79899 Other long term (current) drug therapy: Secondary | ICD-10-CM | POA: Diagnosis not present

## 2022-05-20 DIAGNOSIS — I340 Nonrheumatic mitral (valve) insufficiency: Secondary | ICD-10-CM

## 2022-05-20 NOTE — Telephone Encounter (Signed)
Amiodarone 200 mg # 90 x 3 refill sent to  [facility], [place]

## 2022-05-20 NOTE — Patient Instructions (Signed)
Medication Instructions:  Your physician recommends that you continue on your current medications as directed. Please refer to the Current Medication list given to you today.  *If you need a refill on your cardiac medications before your next appointment, please call your pharmacy*   Lab Work: Your physician recommends that you return for lab work in: Today for a CMP, TSH and T3 T4  If you have labs (blood work) drawn today and your tests are completely normal, you will receive your results only by: [place] (if you have [place]) OR A paper copy in the mail If you have any lab test that is abnormal or we need to change your treatment, we will call you to review the results.   Testing/Procedures: Your physician has requested that you have an echocardiogram. Echocardiography is a painless test that uses sound waves to create images of your heart. It provides your doctor with information about the size and shape of your heart and how well your heart's chambers and valves are working. This procedure takes approximately one hour. There are no restrictions for this procedure. Please do NOT wear cologne, perfume, aftershave, or lotions (deodorant is allowed). Please arrive 15 minutes prior to your appointment time.    Follow-Up: At [facility], you and your health needs are our priority.  As part of our continuing mission to provide you with exceptional heart care, we have created designated Provider Care Teams.  These Care Teams include your primary Cardiologist (physician) and Advanced Practice Providers (APPs -  Physician Assistants and Nurse Practitioners) who all work together to provide you with the care you need, when you need it.  We recommend signing up for the patient portal called "MyChart".  Sign up information is provided on this After Visit Summary.  MyChart is used to connect with patients for Virtual Visits (Telemedicine).  Patients are able to view lab/test results,  encounter notes, upcoming appointments, etc.  Non-urgent messages can be sent to your provider as well.   To learn more about what you can do with MyChart, go to [email or web address].    Your next appointment:   6 month(s)  Provider:   Shirlee More, MD    Other Instructions

## 2022-05-21 LAB — COMPREHENSIVE METABOLIC PANEL
ALT: 13 IU/L (ref 0–32)
AST: 17 IU/L (ref 0–40)
Albumin/Globulin Ratio: 1.9 (ref 1.2–2.2)
Albumin: 4.3 g/dL (ref 3.8–4.8)
Alkaline Phosphatase: 66 IU/L (ref 44–121)
BUN/Creatinine Ratio: 11 — ABNORMAL LOW (ref 12–28)
BUN: 13 mg/dL (ref 8–27)
Bilirubin Total: 0.4 mg/dL (ref 0.0–1.2)
CO2: 21 mmol/L (ref 20–29)
Calcium: 9.6 mg/dL (ref 8.7–10.3)
Chloride: 105 mmol/L (ref 96–106)
Creatinine, Ser: 1.19 mg/dL — ABNORMAL HIGH (ref 0.57–1.00)
Globulin, Total: 2.3 g/dL (ref 1.5–4.5)
Glucose: 84 mg/dL (ref 70–99)
Potassium: 4.1 mmol/L (ref 3.5–5.2)
Sodium: 143 mmol/L (ref 134–144)
Total Protein: 6.6 g/dL (ref 6.0–8.5)
eGFR: 47 mL/min/{1.73_m2} — ABNORMAL LOW (ref >59)

## 2022-05-21 LAB — T4, FREE: Free T4: 1.59 ng/dL (ref 0.82–1.77)

## 2022-05-21 LAB — TSH: TSH: 2.18 u[IU]/mL (ref 0.450–4.500)

## 2022-05-21 LAB — T3, FREE: T3, Free: 2.2 pg/mL (ref 2.0–4.4)

## 2022-05-28 ENCOUNTER — Ambulatory Visit: Payer: Medicare HMO | Attending: Cardiology

## 2022-05-30 DIAGNOSIS — Z961 Presence of intraocular lens: Secondary | ICD-10-CM | POA: Diagnosis not present

## 2022-05-31 ENCOUNTER — Ambulatory Visit: Payer: Medicare HMO | Admitting: Internal Medicine

## 2022-05-31 ENCOUNTER — Encounter: Payer: Self-pay | Admitting: Internal Medicine

## 2022-05-31 VITALS — BP 124/70 | HR 65 | Temp 97.8°F | Resp 18 | Ht 62.0 in | Wt 139.2 lb

## 2022-05-31 DIAGNOSIS — Z131 Encounter for screening for diabetes mellitus: Secondary | ICD-10-CM

## 2022-05-31 DIAGNOSIS — Z6825 Body mass index (BMI) 25.0-25.9, adult: Secondary | ICD-10-CM

## 2022-05-31 DIAGNOSIS — I484 Atypical atrial flutter: Secondary | ICD-10-CM | POA: Diagnosis not present

## 2022-05-31 DIAGNOSIS — N1832 Chronic kidney disease, stage 3b: Secondary | ICD-10-CM | POA: Diagnosis not present

## 2022-05-31 DIAGNOSIS — K529 Noninfective gastroenteritis and colitis, unspecified: Secondary | ICD-10-CM

## 2022-05-31 DIAGNOSIS — M1990 Unspecified osteoarthritis, unspecified site: Secondary | ICD-10-CM

## 2022-05-31 DIAGNOSIS — N189 Chronic kidney disease, unspecified: Secondary | ICD-10-CM | POA: Insufficient documentation

## 2022-05-31 DIAGNOSIS — Z0000 Encounter for general adult medical examination without abnormal findings: Secondary | ICD-10-CM | POA: Diagnosis not present

## 2022-05-31 DIAGNOSIS — M8580 Other specified disorders of bone density and structure, unspecified site: Secondary | ICD-10-CM

## 2022-05-31 HISTORY — DX: Other specified disorders of bone density and structure, unspecified site: M85.80

## 2022-05-31 HISTORY — DX: Chronic kidney disease, unspecified: N18.9

## 2022-05-31 NOTE — Assessment & Plan Note (Signed)
Heart rate is controlled. 

## 2022-05-31 NOTE — Progress Notes (Signed)
Preventive Screening-Counseling & Management     Marcia Leblanc is a 82 y.o. female who presents for Medicare Annual/Subsequent preventive examination.  her last eye exam was on 05/30/22.[name] states there cataract in left eye problems with her vision. her last colonoscopy was on 7/23. her last mammogram was done many years ago.  The patient does not have any problems with urination. she does exercise regularly.   she does get yearly flu vaccines. she had a pneumovax 23 vaccine on in past per patient. she completed her shingrix vaccine on in the past. she has had COVID-19 vaccines withbooster.  There is not depression, anxiety or memory loss.  she is not on an ASA 39m daily.    HPI    Are there smokers in your home (other than you)? No  Risk Factors Current exercise habits: Gym/ health club routine includes stationary bike and treadmill.  Dietary issues discussed: today.   Depression Screen    05/31/2022    9:32 AM 05/31/2022    9:30 AM 08/29/2020    9:14 AM 02/29/2020   10:06 AM 08/31/2019   11:05 AM  Depression screen PHQ 2/9  Decreased Interest 0 0 0 0 0  Down, Depressed, Hopeless 0 0 0 0 0  PHQ - 2 Score 0 0 0 0 0      05/31/2022    9:32 AM 05/31/2022    9:30 AM 08/29/2020    9:14 AM  PHQ9 SCORE ONLY  PHQ-9 Total Score 0 0 0     Activities of Daily Living In your present state of health, do you have any difficulty performing the following activities?:  Driving? Yes Managing money?  Yes Feeding yourself? Yes Getting from bed to chair? YesBP 124/70 (BP Location: Left Arm, Patient Position: Sitting, Cuff Size: Normal)   Pulse 65   Temp 97.8 F (36.6 C)   Resp 18   Ht 5' 2"$  (1.575 m)   Wt 139 lb 4 oz (63.2 kg)   SpO2 97%   BMI 25.47 kg/m   General Appearance:    Alert, cooperative, no distress, appears stated age  Head:    Normocephalic, without obvious abnormality, atraumatic  Eyes:    PERRL, conjunctiva/corneas clear, EOM's intact, fundi    benign, both  eyes  Ears:    Normal TM's and external ear canals, both ears  Nose:   Nares normal, septum midline, mucosa normal, no drainage    or sinus tenderness  Throat:   Lips, mucosa, and tongue normal; teeth and gums normal  Neck:   Supple, symmetrical, trachea midline, no adenopathy;    thyroid:  no enlargement/tenderness/nodules; no carotid   bruit or JVD  Back:     Symmetric, no curvature, ROM normal, no CVA tenderness  Lungs:     Clear to auscultation bilaterally, respirations unlabored  Chest Wall:    No tenderness or deformity   Heart:    Regular rate and rhythm, S1 and S2 normal, no murmur, rub   or gallop  Breast Exam:    No tenderness, masses, or nipple abnormality  Abdomen:     Soft, non-tender, bowel sounds active all four quadrants,    no masses, no organomegaly  Genitalia:    Normal female without lesion, discharge or tenderness  Rectal:    Normal tone, normal prostate, no masses or tenderness;   guaiac negative stool  Extremities:   Extremities normal, atraumatic, no cyanosis or edema  Pulses:   2+ and symmetric all extremities  Skin:   Skin color, texture, turgor normal, no rashes or lesions  Lymph nodes:   Cervical, supraclavicular, and axillary nodes normal  Neurologic:   CNII-XII intact, normal strength, sensation and reflexes    throughout    Preparing food and eating?: Yes Bathing or showering? Yes Getting dressed: Yes Getting to the toilet? Yes Using the toilet:Yes Moving around from place to place: Yes     Social History   Substance and Sexual Activity  Sexual Activity Yes   Are you sexually active?  Yes Do you have more than one partner?  Yes  Hearing Difficulties: Yes Do you often ask people to speak up or repeat themselves? No Do you experience ringing or noises in your ears? No Do you have difficulty understanding soft or whispered voices? No   Do you feel that you have a problem with memory? No  Do you often misplace items? Yes  Do you feel safe at  home?  Yes             Climbing a flight of stairs? Yes Functional Status Survey:     Cognitive Testing  Alert? Yes  Normal Appearance?Yes  Oriented to person? Yes  Place? Yes   Time? Yes  Recall of three objects?  Yes  Can perform simple calculations? Yes  Displays appropriate judgment?Yes  Can read the correct time from a watch face?Yes    05/31/2022    9:33 AM  MMSE - Mini Mental State Exam  Orientation to time 5  Orientation to Place 5  Registration 3  Attention/ Calculation 5  Recall 3  Language- name 2 objects 2  Language- repeat 1  Language- follow 3 step command 3  Language- read & follow direction 1  Write a sentence 1  Copy design 1  Total score 30      Fall Risk Prevention  Any stairs in or around the home? Yes  If so, are there any without handrails? No  Home free of loose throw rugs in walkways, pet beds, electrical cords, etc? No  Adequate lighting in your home to reduce risk of falls? Yes  Use of a cane, walker or w/c? No  In the past year have you fallen or had a near fall?:No     02/15/2019   10:18 AM 08/31/2019   11:05 AM 02/29/2020   10:08 AM 08/29/2020    9:15 AM 05/31/2022    9:29 AM  Fall Risk  Falls in the past year? 0 0 0 0 0     Time Up and Go  Was the test performed? Yes .  Length of time to ambulate to feet: adequate.   Gait steady and fast without use of assistive device    Advanced Directives have been discussed with the patient? Yes   List the Names of Other Physician/Practitioners you currently use: Patient Care Team: Garwin Brothers, MD as PCP - General (Internal Medicine)    Past Medical History:  Diagnosis Date   Arrhythmia    Arthritis    Atrial flutter [facility])    CHF (congestive heart failure) ([place])    Chronic anticoagulation 123456   Diastolic heart failure ([place])    Dyspnea on exertion    Mitral regurgitation    Status post total replacement of left hip 09/23/2014    Past Surgical History:  Procedure  Laterality Date   CARPAL TUNNEL RELEASE Bilateral 68   JOINT REPLACEMENT Right 2012   hip   KNEE ARTHROSCOPY Right  torn cartilage   TOTAL HIP ARTHROPLASTY Left 09/23/2014   Procedure: TOTAL HIP ARTHROPLASTY ANTERIOR APPROACH;  Surgeon: Marybelle Killings, MD;  Location: [place];  Service: Orthopedics;  Laterality: Left;   TUBAL LIGATION  68      Current Medications  Current Outpatient Medications  Medication Sig Dispense Refill   amiodarone (PACERONE) 200 MG tablet TAKE 1 TABLET EVERY DAY 90 tablet 3   Ascorbic Acid (VITAMIN C) 500 MG CAPS Take 500 mg by mouth daily.     Chlorpheniramine-Acetaminophen (CORICIDIN HBP COLD/FLU PO) Take 1 tablet by mouth at bedtime as needed (COLD / FLU).     Cholecalciferol (VITAMIN D) 125 MCG (5000 UT) CAPS Take 5,000 Units by mouth daily.     dapagliflozin propanediol (FARXIGA) 10 MG TABS tablet Take 10 mg by mouth daily.     diltiazem (CARDIZEM) 30 MG tablet Take 1 tablet (30 mg total) by mouth 2 (two) times daily. 180 tablet 1   ELIQUIS 5 MG TABS tablet Take 1 tablet (5 mg total) by mouth 2 (two) times daily. 180 tablet 3   Multiple Vitamin (MULTIVITAMIN) tablet Take 1 tablet by mouth daily.     Nutritional Supplements (IBS SUPPORT PO) Take 1 tablet by mouth 2 (two) times daily. IBS CLEAR     Omega-3 Fatty Acids (FISH OIL) 1000 MG CAPS Take 1 capsule by mouth daily.     Probiotic Product (PROBIOTIC DAILY PO) Take 1 capsule by mouth daily.     Propylene Glycol (SYSTANE BALANCE OP) Apply 1 drop to eye as needed.     No current facility-administered medications for this visit.    Allergies Patient has no known allergies.   Social History Social History   Tobacco Use   Smoking status: Former    Packs/day: 0.50    Years: 50.00    Total pack years: 25.00    Types: Cigarettes    Quit date: 09/11/2012    Years since quitting: 9.7    Passive exposure: Past   Smokeless tobacco: Never  Substance Use Topics   Alcohol use: Not Currently     Review of  Systems Review of Systems  Constitutional: Negative.   HENT: Negative.    Respiratory: Negative.    Cardiovascular: Negative.   Gastrointestinal: Negative.   Neurological: Negative.      Physical Exam:      Body mass index is 25.47 kg/m. BP 124/70 (BP Location: Left Arm, Patient Position: Sitting, Cuff Size: Normal)   Pulse 65   Temp 97.8 F (36.6 C)   Resp 18   Ht 5' 2"$  (1.575 m)   Wt 139 lb 4 oz (63.2 kg)   SpO2 97%   BMI 25.47 kg/m   Physical Exam Constitutional:      Appearance: She is obese.  HENT:     Head: Normocephalic and atraumatic.  Eyes:     Extraocular Movements: Extraocular movements intact.     Pupils: Pupils are equal, round, and reactive to light.  Cardiovascular:     Rate and Rhythm: Regular rhythm.     Pulses: Normal pulses.     Heart sounds: Normal heart sounds.  Pulmonary:     Effort: Pulmonary effort is normal.     Breath sounds: Normal breath sounds.  Abdominal:     General: Bowel sounds are normal.     Palpations: Abdomen is soft.  Musculoskeletal:     Cervical back: Neck supple.  Neurological:     General: No focal deficit present.  Mental Status: She is alert.      Assessment:      No diagnosis found.    Plan:     During the course of the visit the patient was educated and counseled about appropriate screening and preventive services including:   Bone densitometry screening  Diet review for nutrition referral? Yes ____  Not Indicated ____   Patient Instructions (the written plan) was given to the patient.  Colitis She follows with Dr. Lyndel Safe. Her symptoms are stable.  Arthritis Stable.   Atrial flutter ([place]) Heart rate is controlled.   Osteopenia She will continue to do regular exercise     Medicare Attestation I have personally reviewed: The patient's medical and social history Their use of alcohol, tobacco or illicit drugs Their current medications and supplements The patient's functional ability  including ADLs,fall risks, home safety risks, cognitive, and hearing and visual impairment Diet and physical activities Evidence for depression or mood disorders  The patient's weight, height, and BMI have been recorded in the chart.  I have made referrals, counseling, and provided education to the patient based on review of the above and I have provided the patient with a written personalized care plan for preventive services.     Garwin Brothers, MD   05/31/2022

## 2022-05-31 NOTE — Assessment & Plan Note (Signed)
She follows with Dr. Lyndel Safe. Her symptoms are stable.

## 2022-05-31 NOTE — Addendum Note (Signed)
Addended byGarwin Brothers on: 05/31/2022 10:11 AM   Modules accepted: Orders

## 2022-05-31 NOTE — Assessment & Plan Note (Signed)
Stable. 

## 2022-05-31 NOTE — Assessment & Plan Note (Signed)
She will continue to do regular exercise

## 2022-06-03 ENCOUNTER — Encounter: Payer: Self-pay | Admitting: Cardiology

## 2022-06-03 DIAGNOSIS — N1832 Chronic kidney disease, stage 3b: Secondary | ICD-10-CM | POA: Diagnosis not present

## 2022-06-03 DIAGNOSIS — Z131 Encounter for screening for diabetes mellitus: Secondary | ICD-10-CM | POA: Diagnosis not present

## 2022-06-04 LAB — CMP14 + ANION GAP
ALT: 13 IU/L (ref 0–32)
AST: 17 IU/L (ref 0–40)
Albumin/Globulin Ratio: 2 (ref 1.2–2.2)
Albumin: 4.4 g/dL (ref 3.8–4.8)
Alkaline Phosphatase: 70 IU/L (ref 44–121)
Anion Gap: 12 mmol/L (ref 10.0–18.0)
BUN/Creatinine Ratio: 11 — ABNORMAL LOW (ref 12–28)
BUN: 12 mg/dL (ref 8–27)
Bilirubin Total: 0.4 mg/dL (ref 0.0–1.2)
CO2: 23 mmol/L (ref 20–29)
Calcium: 9.1 mg/dL (ref 8.7–10.3)
Chloride: 105 mmol/L (ref 96–106)
Creatinine, Ser: 1.14 mg/dL — ABNORMAL HIGH (ref 0.57–1.00)
Globulin, Total: 2.2 g/dL (ref 1.5–4.5)
Glucose: 91 mg/dL (ref 70–99)
Potassium: 4.1 mmol/L (ref 3.5–5.2)
Sodium: 140 mmol/L (ref 134–144)
Total Protein: 6.6 g/dL (ref 6.0–8.5)
eGFR: 49 mL/min/{1.73_m2} — ABNORMAL LOW (ref >59)

## 2022-06-04 LAB — HEMOGLOBIN A1C
Est. average glucose Bld gHb Est-mCnc: 108 mg/dL
Hgb A1c MFr Bld: 5.4 % (ref 4.8–5.6)

## 2022-06-04 LAB — MAGNESIUM: Magnesium: 2 mg/dL (ref 1.6–2.3)

## 2022-06-04 LAB — CBC WITH DIFFERENTIAL/PLATELET
Basophils Absolute: 0 10*3/uL (ref 0.0–0.2)
Basos: 0 %
EOS (ABSOLUTE): 0.1 10*3/uL (ref 0.0–0.4)
Eos: 2 %
Hematocrit: 42.8 % (ref 34.0–46.6)
Hemoglobin: 14.2 g/dL (ref 11.1–15.9)
Immature Grans (Abs): 0 10*3/uL (ref 0.0–0.1)
Immature Granulocytes: 0 %
Lymphocytes Absolute: 1.7 10*3/uL (ref 0.7–3.1)
Lymphs: 28 %
MCH: 29.8 pg (ref 26.6–33.0)
MCHC: 33.2 g/dL (ref 31.5–35.7)
MCV: 90 fL (ref 79–97)
Monocytes Absolute: 0.4 10*3/uL (ref 0.1–0.9)
Monocytes: 6 %
Neutrophils Absolute: 3.9 10*3/uL (ref 1.4–7.0)
Neutrophils: 64 %
Platelets: 142 10*3/uL — ABNORMAL LOW (ref 150–450)
RBC: 4.76 x10E6/uL (ref 3.77–5.28)
RDW: 14.8 % (ref 11.7–15.4)
WBC: 6.1 10*3/uL (ref 3.4–10.8)

## 2022-06-26 ENCOUNTER — Telehealth: Payer: Self-pay

## 2022-06-26 NOTE — Telephone Encounter (Signed)
Letter received from [employer] patient assistance stating patient was approved for Eliquis through 04/22/2023. Copy of letter in chart media and patient was mailed a copy from company as well.

## 2022-07-07 ENCOUNTER — Other Ambulatory Visit: Payer: Self-pay | Admitting: Cardiology

## 2022-07-08 NOTE — Telephone Encounter (Signed)
Rx refill sent to pharmacy. 

## 2022-07-11 ENCOUNTER — Encounter: Payer: Self-pay | Admitting: Cardiology

## 2022-07-11 DIAGNOSIS — I484 Atypical atrial flutter: Secondary | ICD-10-CM

## 2022-07-22 ENCOUNTER — Ambulatory Visit: Payer: Medicare HMO | Attending: Cardiology

## 2022-07-22 DIAGNOSIS — I484 Atypical atrial flutter: Secondary | ICD-10-CM

## 2022-08-01 ENCOUNTER — Telehealth: Payer: Self-pay | Admitting: Cardiology

## 2022-08-01 ENCOUNTER — Telehealth: Payer: Self-pay

## 2022-08-01 ENCOUNTER — Other Ambulatory Visit: Payer: Self-pay

## 2022-08-01 DIAGNOSIS — I484 Atypical atrial flutter: Secondary | ICD-10-CM | POA: Diagnosis not present

## 2022-08-01 NOTE — Telephone Encounter (Signed)
Called patient and informed her of DR. Munley's recommendation below:  "Looked at her monitor I think she should stop taking the diltiazem and I think she will have to do is watch her blood pressure if she is getting numbers consistently greater than 140 to contact us and continue to use her Kardia device to follow her heart rhythm."   Patient verbalized understanding and had no further questions at this time.

## 2022-08-01 NOTE — Telephone Encounter (Signed)
Marcia Leblanc is calling to report abnormal cardiac results from [facility] monitor. Call being transferred to triage RN.

## 2022-08-01 NOTE — Telephone Encounter (Signed)
Attempted to call the patient and inform her of Dr. Hulen Shouts recommendation below regarding her zio monitor report:  "Looked at her monitor I think she should stop taking the diltiazem and I think she will have to do is watch her blood pressure if she is getting numbers consistently greater than 140 to contact us and continue to use her Kardia device to follow her heart rhythm."  Patient did not answer the phone. Left message for her to call back.

## 2022-08-01 NOTE — Telephone Encounter (Signed)
Called patient and informed her of Dr. Hulen Shouts recommendation below:   "Looked at her monitor I think she should stop taking the diltiazem and I think she will have to do is watch her blood pressure if she is getting numbers consistently greater than 140 to contact us and continue to use her Kardia device to follow her heart rhythm."    Patient verbalized understanding and had no further questions at this time.

## 2022-08-01 NOTE — Telephone Encounter (Signed)
Received a call from [place] at [facility] with a zio alert. Marcia Leblanc reported that the patient wore the heart monitor from 4/1 - 4/8. She reported that the patient had a 3.2 second pause and symptomatic bradycardia on strip 5 on page 9. Her heart rate was 37 bpm for 30 seconds. Sent a message to Dr. Dulce Sellar regarding this zio alert and he stated that he would review it when he got to the office. Will follow up with Dr. Dulce Sellar at the office.

## 2022-08-01 NOTE — Telephone Encounter (Signed)
Dr. Dulce Sellar reviewed the patient's zio heart monitor results and his recommendations are below:  "Looked at her monitor I think she should stop taking the diltiazem and I think she will have to do is watch her blood pressure if she is getting numbers consistently greater than 140 to contact us and continue to use her Kardia device to follow her heart rhythm."  Will contact the patient and inform her of Dr. Hulen Shouts recommendations regarding her heart monitor results.

## 2022-08-01 NOTE — Telephone Encounter (Signed)
Patient is returning call.  °

## 2022-08-01 NOTE — Telephone Encounter (Signed)
Attempted to call the patient and inform her of Dr. Munley's recommendation below regarding her zio monitor report:  "Looked at her monitor I think she should stop taking the diltiazem and I think she will have to do is watch her blood pressure if she is getting numbers consistently greater than 140 to contact us and continue to use her Kardia device to follow her heart rhythm."  Patient did not answer the phone. Left message for her to call back. 

## 2022-08-08 ENCOUNTER — Telehealth: Payer: Self-pay

## 2022-08-08 NOTE — Telephone Encounter (Signed)
Received home blood pressure / heart rate readings for 2 weeks from patient. Forward to Dr Norman Herrlich to review.

## 2022-10-04 ENCOUNTER — Encounter: Payer: Self-pay | Admitting: Student

## 2022-10-04 ENCOUNTER — Ambulatory Visit: Payer: Medicare HMO | Admitting: Student

## 2022-10-04 VITALS — BP 128/82 | HR 88 | Temp 97.6°F | Resp 18 | Ht 62.0 in | Wt 132.4 lb

## 2022-10-04 DIAGNOSIS — J302 Other seasonal allergic rhinitis: Secondary | ICD-10-CM

## 2022-10-04 HISTORY — DX: Other seasonal allergic rhinitis: J30.2

## 2022-10-04 MED ORDER — MONTELUKAST SODIUM 10 MG PO TABS: 10.0000 mg | ORAL_TABLET | Freq: Every day | ORAL | 2 refills | Status: DC

## 2022-10-04 MED ORDER — FLUTICASONE PROPIONATE 50 MCG/ACT NA SUSP: 1.0000 | Freq: Every day | NASAL | 0 refills | Status: DC

## 2022-10-04 NOTE — Assessment & Plan Note (Signed)
Use Flonase and Singulair for relief of symptoms.

## 2022-10-04 NOTE — Progress Notes (Signed)
   Acute Office Visit  Subjective:     Patient ID: Marcia Leblanc, female    DOB: Jan 14, 1941, 82 y.o.   MRN: [id number]  Chief Complaint  Patient presents with   Allergies    Nasal drip    Marcia Leblanc is an 82 year old white female presenting today with complaints of a runny nose. She reports that when she goes outside, she immediatly starts sneezing, coughing, and her nose runs. At night there is post nasal drip. She denies shortness of breath, no wheezing, and no chest pain. She was going to attempt an OTC allergy relief medication but read that it is not good with chronic kidney disease.     Review of Systems  Constitutional: Negative.   HENT:  Positive for congestion. Negative for ear pain and sore throat.   Eyes: Negative.   Respiratory: Negative.    Cardiovascular: Negative.   Skin: Negative.   Neurological: Negative.   Endo/Heme/Allergies:  Positive for environmental allergies.  Psychiatric/Behavioral: Negative.          Objective:    BP 128/82 (BP Location: Right Arm, Patient Position: Sitting, Cuff Size: Normal)   Pulse 88   Temp 97.6 F (36.4 C) (Temporal)   Resp 18   Ht 5\' 2"  (1.575 m)   Wt 132 lb 6.4 oz (60.1 kg)   SpO2 97%   BMI 24.22 kg/m    Physical Exam Constitutional:      Appearance: Normal appearance.  HENT:     Nose: No congestion.     Mouth/Throat:     Mouth: Mucous membranes are moist.     Pharynx: No oropharyngeal exudate or posterior oropharyngeal erythema.  Cardiovascular:     Rate and Rhythm: Normal rate and regular rhythm.     Pulses: Normal pulses.     Heart sounds: Normal heart sounds.  Pulmonary:     Effort: Pulmonary effort is normal. No respiratory distress.     Breath sounds: Normal breath sounds. No wheezing or rhonchi.  Skin:    General: Skin is warm and dry.  Neurological:     Mental Status: She is alert and oriented to person, place, and time.  Psychiatric:        Mood and Affect: Mood normal.        Behavior:  Behavior normal.     No results found for any visits on 10/04/22.      Assessment & Plan:   Problem List Items Addressed This Visit     Seasonal allergic rhinitis - Primary    Use Flonase and Singulair for relief of symptoms.       Relevant Medications   fluticasone (FLONASE) 50 MCG/ACT nasal spray   montelukast (SINGULAIR) 10 MG tablet    Meds ordered this encounter  Medications   fluticasone (FLONASE) 50 MCG/ACT nasal spray    Sig: Place 1 spray into both nostrils daily.    Dispense:  11.1 mL    Refill:  0   montelukast (SINGULAIR) 10 MG tablet    Sig: Take 1 tablet (10 mg total) by mouth daily.    Dispense:  30 tablet    Refill:  2    No follow-ups on file.  Edwena Blow, NP

## 2022-11-11 ENCOUNTER — Other Ambulatory Visit: Payer: Self-pay

## 2022-11-12 NOTE — Progress Notes (Unsigned)
Cardiology Office Note:    Date:  11/13/2022   ID:  Marcia Leblanc, DOB 1941-03-15, MRN [id number]  PCP:  Marcia Northern, MD  Cardiologist:  Marcia Herrlich, MD    Referring MD: Marcia Northern, MD    ASSESSMENT:    1. Atypical atrial flutter (HCC)   2. On amiodarone therapy   3. Chronic anticoagulation   4. Hypertensive heart disease with heart failure (HCC)    PLAN:    In order of problems listed above:  Continue low-dose amiodarone will reduce to every other day the equivalent of 100 mg daily and recheck liver function and thyroid but no obvious toxicity heart rate is improved and not any suppressant therapy.  She will continue to monitor her heart rate at home Continue her current anticoagulant Well compensated initially she had a severe tachycardia induced cardiomyopathy her ejection fraction has normalized her heart failure is compensated without a loop diuretic and she will continue her SGLT2 inhibitor.   Next appointment: 6 months   Medication Adjustments/Labs and Tests Ordered: Current medicines are reviewed at length with the patient today.  Concerns regarding medicines are outlined above.  Orders Placed This Encounter  Procedures   EKG 12-Lead   No orders of the defined types were placed in this encounter.    History of Present Illness:    Marcia Leblanc is a 82 y.o. female with a hx of paroxysmal atrial fibrillation and flutter maintaining sinus rhythm with low-dose amiodarone with chronic anticoagulation hypertensive heart disease with heart failure and mitral regurgitation last seen 05/20/2022.  Compliance with diet, lifestyle and medications: She continues to do well compliant with her medications her home heart rates run in the 70 to 80 bpm range She has had no shortness of breath edema chest pain palpitation or syncope Past Medical History:  Diagnosis Date   Arrhythmia    Arthritis    Atrial flutter (HCC)    CHF (congestive heart failure) (HCC)    Chronic  anticoagulation 12/18/2018   CKD (chronic kidney disease) 05/31/2022   Colitis 03/13/2022   Diastolic heart failure (HCC)    Dyspnea on exertion    Mitral regurgitation    Osteopenia 05/31/2022   Seasonal allergic rhinitis 10/04/2022   Status post total replacement of left hip 09/23/2014    Current Medications: Current Meds  Medication Sig   amiodarone (PACERONE) 200 MG tablet Take 200 mg by mouth daily.   Ascorbic Acid (VITAMIN C) 500 MG CAPS Take 500 mg by mouth daily.   Cholecalciferol (VITAMIN D) 125 MCG (5000 UT) CAPS Take 5,000 Units by mouth daily.   dapagliflozin propanediol (FARXIGA) 10 MG TABS tablet Take 10 mg by mouth daily.   ELIQUIS 5 MG TABS tablet Take 1 tablet (5 mg total) by mouth 2 (two) times daily.   fluticasone (FLONASE) 50 MCG/ACT nasal spray Place 1 spray into both nostrils daily.   montelukast (SINGULAIR) 10 MG tablet Take 1 tablet (10 mg total) by mouth daily.   Multiple Vitamin (MULTIVITAMIN) tablet Take 1 tablet by mouth daily.   Omega-3 Fatty Acids (FISH OIL) 1000 MG CAPS Take 1 capsule by mouth daily.   Probiotic Product (PROBIOTIC DAILY PO) Take 2 capsules by mouth daily.   Propylene Glycol (SYSTANE BALANCE OP) Apply 1 drop to eye as needed (dry eyes).      EKGs/Labs/Other Studies Reviewed:    The following studies were reviewed today:  Cardiac Studies & Procedures       ECHOCARDIOGRAM  ECHOCARDIOGRAM COMPLETE 05/26/2019  Narrative ECHOCARDIOGRAM REPORT    Patient Name:   Marcia Leblanc Date of Exam: 05/26/2019 Medical Rec #:  [id number]     Height:       64.0 in Accession #:    [id number]    Weight:       142.0 lb Date of Birth:  Apr 28, 1940     BSA:          1.69 m Patient Age:    78 years      BP:           150/80 mmHg Patient Gender: F             HR:           84 bpm. Exam Location:  M[facility]  Procedure: 2D Echo  Indications:    Atypical atrial flutter (HCC) [I48.4 (ICD-10-CM)]; Chronic diastolic heart failure (HCC) [I50.32  (ICD-10-CM)]; Nonrheumatic mitral valve regurgitation [I34.0 (ICD-10-CM)]  History:        Patient has prior history of Echocardiogram examinations, most recent 12/10/2018.  Sonographer:    Marcia Leblanc Referring Phys: [phone] Marcia Leblanc  IMPRESSIONS   1. Left ventricular ejection fraction, by visual estimation, is 60 to 65%. The left ventricle has normal function. There is no left ventricular hypertrophy. 2. Left ventricular diastolic parameters are consistent with Grade I diastolic dysfunction (impaired relaxation). 3. Left atrial size was severely dilated. 4. Right atrial size was normal. 5. Moderate mitral valve regurgitation. Mild mitral stenosis. 6. The tricuspid valve is normal in structure. 7. The tricuspid valve is normal in structure. Tricuspid valve regurgitation is mild. 8. The aortic valve is normal in structure. Aortic valve regurgitation is trivial. No evidence of aortic valve sclerosis or stenosis. 9. The pulmonic valve was normal in structure. Pulmonic valve regurgitation is not visualized. 10. Mildly elevated pulmonary artery systolic pressure.  FINDINGS Left Ventricle: Left ventricular ejection fraction, by visual estimation, is 60 to 65%. The left ventricle has normal function. The left ventricle has no regional wall motion abnormalities. There is no left ventricular hypertrophy. Left ventricular diastolic parameters are consistent with Grade I diastolic dysfunction (impaired relaxation). Normal left atrial pressure.  Right Ventricle: The right ventricular size is normal. No increase in right ventricular wall thickness. Global RV systolic function is has normal systolic function. The tricuspid regurgitant velocity is 2.80 m/s, and with an assumed right atrial pressure of 3 mmHg, the estimated right ventricular systolic pressure is mildly elevated at 34.2 mmHg.  Left Atrium: Left atrial size was severely dilated.  Right Atrium: Right atrial size was normal in  size  Pericardium: There is no evidence of pericardial effusion.  Mitral Valve: The mitral valve is normal in structure. Moderate mitral valve regurgitation. Mild mitral valve stenosis by observation. MV peak gradient, 17.2 mmHg.  Tricuspid Valve: The tricuspid valve is normal in structure. Tricuspid valve regurgitation is mild.  Aortic Valve: The aortic valve is normal in structure. Aortic valve regurgitation is trivial. The aortic valve is structurally normal, with no evidence of sclerosis or stenosis.  Pulmonic Valve: The pulmonic valve was normal in structure. Pulmonic valve regurgitation is not visualized. Pulmonic regurgitation is not visualized.  Aorta: The aortic root, ascending aorta and aortic arch are all structurally normal, with no evidence of dilitation or obstruction.  Venous: The inferior vena cava is normal in size with greater than 50% respiratory variability, suggesting right atrial pressure of 3 mmHg.  IAS/Shunts: No atrial level shunt detected by color flow Doppler. There is no  evidence of a patent foramen ovale. No ventricular septal defect is seen or detected. There is no evidence of an atrial septal defect.   LEFT VENTRICLE PLAX 2D LVIDd:         4.40 cm LVIDs:         2.90 cm LV PW:         1.10 cm LV IVS:        1.00 cm LVOT diam:     2.30 cm LV SV:         55 ml LV SV Index:   32.34 LVOT Area:     4.15 cm   IVC IVC diam: 1.70 cm  LEFT ATRIUM              Index       RIGHT ATRIUM           Index LA diam:        5.10 cm  3.02 cm/m  RA Area:     15.70 cm LA Vol (A2C):   115.0 ml 67.99 ml/m RA Volume:   38.30 ml  22.64 ml/m LA Vol (A4C):   131.0 ml 77.45 ml/m LA Biplane Vol: 123.0 ml 72.72 ml/m AORTIC VALVE LVOT Vmax:   105.00 cm/s LVOT Vmean:  70.900 cm/s LVOT VTI:    0.203 m  AORTA Ao Root diam: 3.20 cm Ao Asc diam:  3.60 cm  MITRAL VALVE                         TRICUSPID VALVE MV Area (PHT): 1.82 cm              TR Peak grad:   31.2  mmHg MV Peak grad:  17.2 mmHg             TR Vmax:        295.00 cm/s MV Mean grad:  8.0 mmHg MV Vmax:       2.08 m/s              SHUNTS MV Vmean:      136.0 cm/s            Systemic VTI:  0.20 m MV VTI:        0.63 m                Systemic Diam: 2.30 cm MV PHT:        120.93 msec MV Decel Time: 417 msec MR Peak grad:    138.3 mmHg MR Mean grad:    76.0 mmHg MR Vmax:         588.00 cm/s MR Vmean:        397.0 cm/s MR PISA:         6.28 cm MR PISA Eff ROA: 41 mm MR PISA Radius:  1.00 cm MV E velocity: 168.00 cm/s 103 cm/s MV A velocity: 177.00 cm/s 70.3 cm/s MV E/A ratio:  0.95        1.5   Glean Hess Revankar MD Electronically signed by Belva Crome MD Signature Date/Time: 05/27/2019/4:16:41 PM    Final    MONITORS  LONG TERM MONITOR (3-14 DAYS) 08/01/2022  Narrative Patch Wear Time:  6 days and 19 hours (2024-04-01T15:03:45-0400 to 2024-04-08T10:10:22-0400)  Patient had a min HR of 30 bpm, max HR of 80 bpm, and avg HR of 56 bpm. Predominant underlying rhythm was Sinus Rhythm. Intermittent Bundle Branch Block was present.  There was 1 diary events sinus rhythm  59 bpm  1 Pause occurred nocturnally lasting 3.2 secs (19 bpm).  Isolated SVEs were occasional (1.8%, 9516), SVE Couplets were rare (<1.0%, 117), and SVE Triplets were rare (<1.0%, 17).  There were no episodes of atrial fibrillation or flutter  Isolated VEs were rare (<1.0%, 324), VE Triplets were rare (<1.0%, 6), and no VE Couplets were present.          EKG Interpretation Date/Time:  Wednesday November 13 2022 08:30:22 EDT Ventricular Rate:  70 PR Interval:  170 QRS Duration:  102 QT Interval:  438 QTC Calculation: 473 R Axis:   63  Text Interpretation: Normal sinus rhythm Incomplete right bundle branch block Nonspecific ST and T wave abnormality Prolonged QT When compared with ECG of 12-Sep-2014 09:49, Premature ventricular complexes are no longer Present Confirmed by Marcia Leblanc ([id number]) on 11/13/2022  8:36:54 AM   Recent Labs: 05/20/2022: TSH 2.180 06/03/2022: ALT 13; BUN 12; Creatinine, Ser 1.14; Hemoglobin 14.2; Magnesium 2.0; Platelets 142; Potassium 4.1; Sodium 140  Recent Lipid Panel No results found for: "CHOL", "TRIG", "HDL", "CHOLHDL", "VLDL", "LDLCALC", "LDLDIRECT"  Physical Exam:    VS:  BP 136/72   Pulse 70   Ht 5\' 2"  (1.575 m)   Wt 133 lb 12.8 oz (60.7 kg)   SpO2 96%   BMI 24.47 kg/m     Wt Readings from Last 3 Encounters:  11/13/22 133 lb 12.8 oz (60.7 kg)  10/04/22 132 lb 6.4 oz (60.1 kg)  05/31/22 139 lb 4 oz (63.2 kg)     GEN:  Well nourished, well developed in no acute distress HEENT: Normal NECK: No JVD; No carotid bruits LYMPHATICS: No lymphadenopathy CARDIAC: RRR, no murmurs, rubs, gallops RESPIRATORY:  Clear to auscultation without rales, wheezing or rhonchi  ABDOMEN: Soft, non-tender, non-distended MUSCULOSKELETAL:  No edema; No deformity  SKIN: Warm and dry NEUROLOGIC:  Alert and oriented x 3 PSYCHIATRIC:  Normal affect    Signed, Marcia Herrlich, MD  11/13/2022 8:50 AM    [facility]

## 2022-11-13 ENCOUNTER — Ambulatory Visit: Payer: Medicare HMO | Attending: Cardiology | Admitting: Cardiology

## 2022-11-13 ENCOUNTER — Encounter: Payer: Self-pay | Admitting: Cardiology

## 2022-11-13 VITALS — BP 136/72 | HR 70 | Ht 62.0 in | Wt 133.8 lb

## 2022-11-13 DIAGNOSIS — I484 Atypical atrial flutter: Secondary | ICD-10-CM

## 2022-11-13 DIAGNOSIS — Z7901 Long term (current) use of anticoagulants: Secondary | ICD-10-CM | POA: Diagnosis not present

## 2022-11-13 DIAGNOSIS — Z79899 Other long term (current) drug therapy: Secondary | ICD-10-CM

## 2022-11-13 DIAGNOSIS — I110 Hypertensive heart disease with heart failure: Secondary | ICD-10-CM | POA: Diagnosis not present

## 2022-11-13 MED ORDER — AMIODARONE HCL 200 MG PO TABS: 200.0000 mg | ORAL_TABLET | ORAL | 3 refills | Status: DC

## 2022-11-13 NOTE — Patient Instructions (Signed)
Medication Instructions:  Your physician has recommended you make the following change in your medication:   START: Amiodarone 200 mg every other day  *If you need a refill on your cardiac medications before your next appointment, please call your pharmacy*   Lab Work: Your physician recommends that you return for lab work in:   Labs today: CMP, TSH T3 T4  If you have labs (blood work) drawn today and your tests are completely normal, you will receive your results only by: MyChart Message (if you have MyChart) OR A paper copy in the mail If you have any lab test that is abnormal or we need to change your treatment, we will call you to review the results.   Testing/Procedures: None   Follow-Up: At [facility], you and your health needs are our priority.  As part of our continuing mission to provide you with exceptional heart care, we have created designated Provider Care Teams.  These Care Teams include your primary Cardiologist (physician) and Advanced Practice Providers (APPs -  Physician Assistants and Nurse Practitioners) who all work together to provide you with the care you need, when you need it.  We recommend signing up for the patient portal called "MyChart".  Sign up information is provided on this After Visit Summary.  MyChart is used to connect with patients for Virtual Visits (Telemedicine).  Patients are able to view lab/test results, encounter notes, upcoming appointments, etc.  Non-urgent messages can be sent to your provider as well.   To learn more about what you can do with MyChart, go to [email or web address].    Your next appointment:   6 month(s)  Provider:   Norman Herrlich, MD    Other Instructions None

## 2022-11-14 LAB — COMPREHENSIVE METABOLIC PANEL
AST: 18 IU/L (ref 0–40)
Albumin: 4.2 g/dL (ref 3.8–4.8)
Alkaline Phosphatase: 61 IU/L (ref 44–121)
BUN/Creatinine Ratio: 13 (ref 12–28)
BUN: 16 mg/dL (ref 8–27)
CO2: 24 mmol/L (ref 20–29)
Calcium: 9.5 mg/dL (ref 8.7–10.3)
Creatinine, Ser: 1.21 mg/dL — ABNORMAL HIGH (ref 0.57–1.00)
Globulin, Total: 2.3 g/dL (ref 1.5–4.5)
Glucose: 80 mg/dL (ref 70–99)
Potassium: 4.1 mmol/L (ref 3.5–5.2)
Sodium: 143 mmol/L (ref 134–144)
Total Protein: 6.5 g/dL (ref 6.0–8.5)

## 2022-11-14 LAB — TSH+T4F+T3FREE
Free T4: 1.75 ng/dL (ref 0.82–1.77)
T3, Free: 2.2 pg/mL (ref 2.0–4.4)
TSH: 1.77 u[IU]/mL (ref 0.450–4.500)

## 2022-12-05 ENCOUNTER — Other Ambulatory Visit: Payer: Self-pay | Admitting: Student

## 2022-12-05 DIAGNOSIS — J302 Other seasonal allergic rhinitis: Secondary | ICD-10-CM

## 2022-12-20 ENCOUNTER — Other Ambulatory Visit: Payer: Self-pay

## 2022-12-20 ENCOUNTER — Other Ambulatory Visit: Payer: Self-pay | Admitting: Student

## 2022-12-20 DIAGNOSIS — J302 Other seasonal allergic rhinitis: Secondary | ICD-10-CM

## 2022-12-20 MED ORDER — CIPROFLOXACIN HCL 500 MG PO TABS: 500.0000 mg | ORAL_TABLET | Freq: Two times a day (BID) | ORAL | 0 refills | Status: DC

## 2022-12-20 MED ORDER — AMOXICILLIN-POT CLAVULANATE 500-125 MG PO TABS: 1.0000 | ORAL_TABLET | Freq: Two times a day (BID) | ORAL | 0 refills | Status: DC

## 2023-01-01 ENCOUNTER — Encounter: Payer: Self-pay | Admitting: Student

## 2023-01-01 ENCOUNTER — Ambulatory Visit: Payer: Medicare HMO | Admitting: Student

## 2023-01-01 VITALS — BP 146/82 | HR 67 | Temp 97.8°F | Resp 17 | Ht 62.0 in | Wt 133.5 lb

## 2023-01-01 DIAGNOSIS — R42 Dizziness and giddiness: Secondary | ICD-10-CM | POA: Insufficient documentation

## 2023-01-01 HISTORY — DX: Dizziness and giddiness: R42

## 2023-01-01 NOTE — Progress Notes (Signed)
Acute Office Visit  Subjective:     Patient ID: Marcia Leblanc, female    DOB: 1941/01/26, 82 y.o.   MRN: [id number]  Chief Complaint  Patient presents with   Hypertension    Dizziness and elevated BP since yesterday    HPI  Patient is in today for dizziness and elevated blood pressure readings at home for 2 days. BP 161/72 & 158/72 with heart rates in the 60's. The dizziness occurs with changing position.  She tells me that she has been under a lot of stress dealing with her husbands change in mental status. She says she thinks that he has dementia; pacing, rambling, early waking and accusations that she's stolen things from him. She denies chest pain, shortness of breath, headaches, tingling, or weakness in her legs.   Using Kardia Device during the times of dizziness, she says readings are normal sinus rhythm with rates in the 60's.  She saw Dr. Dulce Sellar with cardiology on 11/13/2022. She remains on anticoagulant therapy and amiodarone 100 mg every other day. At that time she had severe tachycardia induced cardiomyopathy but her EF had normalized without a loop diuretic. Thyroid panel was unremarkable and kidney function showed creatinine of 1.21 and eGFR of 45.  She tells me that she is due for echocardiogram due to discomfort of laying down for long period of time.  Review of Systems  Constitutional: Negative.   Eyes: Negative.   Cardiovascular: Negative.   Gastrointestinal: Negative.   Neurological:  Positive for dizziness.  Psychiatric/Behavioral:  The patient is nervous/anxious.         Objective:    BP (!) 146/82 (BP Location: Left Arm, Patient Position: Sitting, Cuff Size: Normal)   Pulse 67 Comment: laying  Temp 97.8 F (36.6 C)   Resp 17   Ht 5\' 2"  (1.575 m)   Wt 133 lb 8 oz (60.6 kg)   SpO2 98%   BMI 24.42 kg/m    Physical Exam Constitutional:      Appearance: Normal appearance.  Eyes:     Extraocular Movements: Extraocular movements intact.   Cardiovascular:     Rate and Rhythm: Normal rate and regular rhythm.     Pulses: Normal pulses.     Heart sounds: Normal heart sounds.  Pulmonary:     Effort: Pulmonary effort is normal. No respiratory distress.  Abdominal:     General: Bowel sounds are normal.     Palpations: Abdomen is soft.  Musculoskeletal:     Cervical back: Normal range of motion.  Skin:    General: Skin is warm and dry.     Capillary Refill: Capillary refill takes less than 2 seconds.  Neurological:     General: No focal deficit present.     Mental Status: She is alert and oriented to person, place, and time.     Cranial Nerves: Cranial nerves 2-12 are intact.     Sensory: Sensation is intact.     Motor: Motor function is intact.     Coordination: Coordination is intact.     Gait: Gait is intact.  Psychiatric:        Attention and Perception: Attention normal.        Mood and Affect: Affect normal. Mood is anxious.        Speech: Speech normal.     Orthostatic Vitals for the past 48 hrs (Last 6 readings):  Patient Position Orthostatic BP Orthostatic Pulse BP Pulse BP Location Cuff Size  01/01/23 1000 Sitting -- -- Marland Kitchen  146/82 80 Left Arm Normal  01/01/23 1136 -- 128/70 -- -- -- -- --  01/01/23 1138 -- 118/80 -- -- -- -- --  01/01/23 1139 -- 140/70 -- -- -- -- --  01/01/23 1140 -- -- 67 -- -- -- --  01/01/23 1141 -- -- -- -- 67 -- --  01/01/23 1142 -- -- 68 -- -- -- --  01/01/23 1143 -- -- 72 -- -- -- --  01/01/23 1144 -- 128/70 67 -- -- -- --  01/01/23 1145 -- 118/80 68 -- -- -- --  01/01/23 1146 -- 140/70 72 -- -- -- --           Assessment & Plan:   Problem List Items Addressed This Visit     Dizziness - Primary    I have collected an EKG which shows stable rhythm with a rate of 72 bpm.  She will continue to use the Kardia device to monitor rhythm.  Orthostatic vitals were negative.  She is due for echocardiogram and I asked her to call cardiology to set this up.  I feel the dizziness is  not related to hydration but to social stress.  We will continue to monitor. CMP collected per patient request to check kidney function.       Relevant Orders   EKG 12-Lead   Comprehensive metabolic panel   Orthostatic vital signs        Edwena Blow, NP

## 2023-01-01 NOTE — Assessment & Plan Note (Addendum)
I have collected an EKG which shows stable rhythm with a rate of 72 bpm.  She will continue to use the Kardia device to monitor rhythm.  Orthostatic vitals were negative.  She is due for echocardiogram and I asked her to call cardiology to set this up.  I feel the dizziness is not related to hydration but to social stress.  We will continue to monitor. CMP collected per patient request to check kidney function.

## 2023-01-02 LAB — COMPREHENSIVE METABOLIC PANEL
ALT: 16 IU/L (ref 0–32)
AST: 18 IU/L (ref 0–40)
Albumin: 3.6 g/dL — ABNORMAL LOW (ref 3.8–4.8)
Alkaline Phosphatase: 99 IU/L (ref 44–121)
BUN/Creatinine Ratio: 7 — ABNORMAL LOW (ref 12–28)
BUN: 6 mg/dL — ABNORMAL LOW (ref 8–27)
Bilirubin Total: 0.3 mg/dL (ref 0.0–1.2)
CO2: 24 mmol/L (ref 20–29)
Calcium: 8.9 mg/dL (ref 8.7–10.3)
Chloride: 102 mmol/L (ref 96–106)
Creatinine, Ser: 0.81 mg/dL (ref 0.57–1.00)
Globulin, Total: 2.2 g/dL (ref 1.5–4.5)
Glucose: 93 mg/dL (ref 70–99)
Potassium: 3.6 mmol/L (ref 3.5–5.2)
Sodium: 142 mmol/L (ref 134–144)
Total Protein: 5.8 g/dL — ABNORMAL LOW (ref 6.0–8.5)
eGFR: 73 mL/min/{1.73_m2} (ref >59)

## 2023-01-02 NOTE — Progress Notes (Signed)
I have reviewed the results of your recent test and lab work that you underwent at our clinic. Findings indicate normal kidney function. You are mildly dehydrated. Please increase oral fluid hydration.

## 2023-01-02 NOTE — Progress Notes (Signed)
Leak, Luetta Nutting, NP  Christianne Dolin, CMA I have reviewed the results of your recent test and lab work that you underwent at our clinic. Findings indicate normal kidney function. You are mildly dehydrated. Please increase oral fluid hydration.  Patient informed of results and recommendations

## 2023-01-17 ENCOUNTER — Ambulatory Visit: Payer: Medicare HMO

## 2023-01-17 ENCOUNTER — Other Ambulatory Visit: Payer: Self-pay | Admitting: Student

## 2023-01-17 ENCOUNTER — Other Ambulatory Visit: Payer: Self-pay

## 2023-01-17 DIAGNOSIS — Z23 Encounter for immunization: Secondary | ICD-10-CM

## 2023-01-17 NOTE — Progress Notes (Signed)
Nurse visit Flu shot only

## 2023-02-05 ENCOUNTER — Ambulatory Visit: Payer: Medicare HMO | Attending: Cardiology

## 2023-02-05 DIAGNOSIS — I110 Hypertensive heart disease with heart failure: Secondary | ICD-10-CM | POA: Diagnosis not present

## 2023-02-05 DIAGNOSIS — Z79899 Other long term (current) drug therapy: Secondary | ICD-10-CM | POA: Diagnosis not present

## 2023-02-05 DIAGNOSIS — I340 Nonrheumatic mitral (valve) insufficiency: Secondary | ICD-10-CM | POA: Diagnosis not present

## 2023-02-05 DIAGNOSIS — I484 Atypical atrial flutter: Secondary | ICD-10-CM | POA: Diagnosis not present

## 2023-02-05 DIAGNOSIS — Z7901 Long term (current) use of anticoagulants: Secondary | ICD-10-CM

## 2023-02-06 ENCOUNTER — Encounter: Payer: Self-pay | Admitting: Student

## 2023-02-06 LAB — ECHOCARDIOGRAM COMPLETE
AR max vel: 1.38 cm2
AV Area VTI: 1.33 cm2
AV Area mean vel: 1.33 cm2
AV Mean grad: 9.7 mm[Hg]
AV Peak grad: 18 mm[Hg]
Ao pk vel: 2.12 m/s
Area-P 1/2: 1.61 cm2
MV M vel: 5.35 m/s
MV Peak grad: 114.5 mm[Hg]
MV VTI: 0.87 cm2
P 1/2 time: 538 ms
Radius: 0.7 cm
S' Lateral: 3 cm

## 2023-03-17 ENCOUNTER — Other Ambulatory Visit: Payer: Self-pay | Admitting: Student

## 2023-03-17 ENCOUNTER — Other Ambulatory Visit: Payer: Self-pay | Admitting: Internal Medicine

## 2023-03-17 MED ORDER — AMOXICILLIN-POT CLAVULANATE 500-125 MG PO TABS
ORAL_TABLET | ORAL | 0 refills | Status: DC
Start: 2023-03-17 — End: 2023-05-29

## 2023-03-17 NOTE — Telephone Encounter (Signed)
Order sent to pharmacy

## 2023-05-08 ENCOUNTER — Ambulatory Visit: Payer: Medicare HMO | Admitting: Cardiology

## 2023-05-27 ENCOUNTER — Encounter: Payer: Self-pay | Admitting: Cardiology

## 2023-05-28 NOTE — Progress Notes (Signed)
 Cardiology Office Note:    Date:  05/29/2023   ID:  Marcia Leblanc, DOB [birth year], MRN [id number]  PCP:  Monty Daphne CROME, NP  Cardiologist:  Redell Leiter, MD    Referring MD: Monty Daphne CROME, NP please do CMP thyroid  panel TSH T3-T4 with labs in your office every 6 months on amiodarone  3 to 5% developed hypothyroidism rarely 1 to 2% developed thyrotoxicosis.   ASSESSMENT:    1. Atypical atrial flutter (HCC)   2. On amiodarone  therapy   3. Chronic anticoagulation   4. Hypertensive heart disease with heart failure (HCC)   5. Nonrheumatic mitral valve regurgitation   6. Nonrheumatic mitral valve stenosis    PLAN:    In order of problems listed above:  Overall she has done well fortunately low-dose amiodarone  she maintains sinus rhythm continues to be anticoagulated next prescription will reduce her to the age and body mass appropriate dose 2.5 mg twice daily. If recurrent episodes lightheadedness or syncope she said she would contact me and we would apply an ambulatory heart rhythm monitor Blood pressure control is a concern I think the best approach is good technique validated device she will check home blood pressures and give me a list at this time I am hesitant to give her additional antihypertensive medications notes she is taking an SGLT2 inhibitor Her mitral valve disease is not severe she will need a follow-up echocardiogram 1 to 2 years   Next appointment: 6 months to continue to screen therapy   Medication Adjustments/Labs and Tests Ordered: Current medicines are reviewed at length with the patient today.  Concerns regarding medicines are outlined above.  Orders Placed This Encounter  Procedures   EKG 12-Lead   Meds ordered this encounter  Medications   amiodarone  (PACERONE ) 200 MG tablet    Sig: Take 1 tablet (200 mg total) by mouth every other day.    Dispense:  45 tablet    Refill:  3     History of Present Illness:    Marcia Leblanc is a 83 y.o. female with a  hx of atypical atrial flutter maintaining sinus rhythm with low-dose amiodarone  chronic anticoagulation and hypertensive heart disease with heart failure last seen 11/13/2022.  An echocardiogram 02/05/2023 showing normal left ventricular systolic function EF 60 to 65% indeterminant diastolic function right ventricle normal size function and pulmonary artery pressure she had severe left atrial enlargement mild to moderate calcific mitral stenosis moderate mitral regurgitation.  Compliance with diet, lifestyle and medications: Yes  She is under a great deal of stress at home her husband has not only lost vision but is developed significant dementia Overall she has done well she has had no edema shortness of breath palpitation chest pain or syncope. She tolerates her anticoagulant without bleeding and with age and body mass working to reduce to 2.5 mg dose next prescription Had 1 episode 1 morning where she got up quickly from bed felt lightheaded and immediately recovered She is in sinus rhythm today we discussed doing an ambulatory heart rhythm monitor and she just does not feel is necessary at this time Her home blood pressure runs in the range of 140/60-70 and we decided to monitor trend review but I am hesitant to give her any antihypertensive agent with the episode that occurred in the early morning Her echocardiogram is reassuring with age she has developed calcific mitral valve disease it is not severe and generally does not lead to valve intervention in her age group  Labs 01/16/2023  hemoglobin 14.2 creatinine 0.8 potassium 3.6 TSH 1.77 Past Medical History:  Diagnosis Date   Arrhythmia    Arthritis    Atrial flutter (HCC)    CHF (congestive heart failure) (HCC)    Chronic anticoagulation 12/18/2018   CKD (chronic kidney disease) 05/31/2022   Colitis 03/13/2022   Diastolic heart failure (HCC)    Dizziness 01/01/2023   Dyspnea on exertion    Mitral regurgitation    Osteopenia  05/31/2022   Seasonal allergic rhinitis 10/04/2022   Status post total replacement of left hip 09/23/2014    Current Medications: Current Meds  Medication Sig   amiodarone  (PACERONE ) 200 MG tablet Take 1 tablet (200 mg total) by mouth every other day.   amoxicillin -clavulanate (AUGMENTIN ) 500-125 MG tablet Take 1 tablet by mouth in the morning and at bedtime.   Ascorbic Acid (VITAMIN C) 500 MG CAPS Take 500 mg by mouth daily.   Cholecalciferol (VITAMIN D) 125 MCG (5000 UT) CAPS Take 5,000 Units by mouth daily.   dapagliflozin  propanediol (FARXIGA ) 10 MG TABS tablet Take 10 mg by mouth daily.   fluticasone  (FLONASE ) 50 MCG/ACT nasal spray USE 1 SPRAY IN EACH NOSTRIL EVERY DAY   montelukast  (SINGULAIR ) 10 MG tablet TAKE 1 TABLET EVERY DAY   Multiple Vitamin (MULTIVITAMIN) tablet Take 1 tablet by mouth daily.   Omega-3 Fatty Acids (FISH OIL) 1000 MG CAPS Take 1 capsule by mouth daily.   Probiotic Product (PROBIOTIC DAILY PO) Take 2 capsules by mouth daily.   Propylene Glycol (SYSTANE BALANCE OP) Apply 1 drop to eye as needed (dry eyes).   [DISCONTINUED] amiodarone  (PACERONE ) 200 MG tablet Take 1 tablet (200 mg total) by mouth every other day.   [DISCONTINUED] ELIQUIS  5 MG TABS tablet Take 1 tablet (5 mg total) by mouth 2 (two) times daily.      EKGs/Labs/Other Studies Reviewed:    The following studies were reviewed today:  Cardiac Studies & Procedures      ECHOCARDIOGRAM  ECHOCARDIOGRAM COMPLETE 02/05/2023  Narrative ECHOCARDIOGRAM REPORT    Patient Name:   Marcia Leblanc Date of Exam: 02/05/2023 Medical Rec #:  [id number]     Height:       62.0 in Accession #:    [id number]    Weight:       133.5 lb Date of Birth:  01/01/1941     BSA:          1.610 m Patient Age:    82 years      BP:           146/82 mmHg Patient Gender: F             HR:           70 bpm. Exam Location:  [facility]  Procedure: 2D Echo, Cardiac Doppler, Color Doppler and Strain Analysis  Indications:     Atypical atrial flutter (HCC) [I48.4 (ICD-10-CM)]; On amiodarone  therapy [Z79.899 (ICD-10-CM)]; Chronic anticoagulation [Z79.01 (ICD-10-CM)]; Hypertensive heart disease with heart failure (HCC) [I11.0 (ICD-10-CM)]; Nonrheumatic mitral valve regurgitation [I34.0 (ICD-10-CM)]  History:        Patient has prior history of Echocardiogram examinations, most recent 05/27/2019. Arrythmias:Atrial Flutter; Signs/Symptoms:Hypertensive Heart Disease.  Sonographer:    Lynwood Silvas RDCS Referring Phys: [id number] Augusten Lipkin J Macio Kissoon  IMPRESSIONS   1. Left ventricular ejection fraction, by estimation, is 60 to 65%. The left ventricle has normal function. The left ventricle has no regional wall motion abnormalities. Left ventricular diastolic parameters are indeterminate. The average left ventricular global  longitudinal strain is -12.4 %. The global longitudinal strain is abnormal. 2. Right ventricular systolic function is normal. The right ventricular size is normal. There is normal pulmonary artery systolic pressure. 3. Left atrial size was severely dilated. 4. ERO 0.22 cm2, MR Volume39 cc, MVA 0.83 cm2, mean gradient 7 mmHg. The mitral valve is degenerative. Moderate mitral valve regurgitation. Moderate to severe mitral stenosis. The mean mitral valve gradient is 7.5 mmHg. 5. Tricuspid valve regurgitation is mild to moderate. 6. The aortic valve is calcified. There is mild calcification of the aortic valve. There is mild thickening of the aortic valve. Aortic valve regurgitation is mild. Aortic valve sclerosis/calcification is present, without any evidence of aortic stenosis. 7. The inferior vena cava is normal in size with greater than 50% respiratory variability, suggesting right atrial pressure of 3 mmHg.  FINDINGS Left Ventricle: Left ventricular ejection fraction, by estimation, is 60 to 65%. The left ventricle has normal function. The left ventricle has no regional wall motion abnormalities. The average left  ventricular global longitudinal strain is -12.4 %. The global longitudinal strain is abnormal. The left ventricular internal cavity size was normal in size. There is borderline left ventricular hypertrophy. Left ventricular diastolic parameters are indeterminate.  Right Ventricle: The right ventricular size is normal. No increase in right ventricular wall thickness. Right ventricular systolic function is normal. There is normal pulmonary artery systolic pressure. The tricuspid regurgitant velocity is 2.78 m/s, and with an assumed right atrial pressure of 3 mmHg, the estimated right ventricular systolic pressure is 33.9 mmHg.  Left Atrium: Left atrial size was severely dilated.  Right Atrium: Right atrial size was normal in size.  Pericardium: There is no evidence of pericardial effusion.  Mitral Valve: ERO 0.22 cm2, MR Volume39 cc, MVA 0.83 cm2, mean gradient 7 mmHg. The mitral valve is degenerative in appearance. There is mild thickening of the mitral valve leaflet(s). Moderate mitral valve regurgitation. Moderate to severe mitral valve stenosis. MV peak gradient, 13.8 mmHg. The mean mitral valve gradient is 7.5 mmHg.  Tricuspid Valve: The tricuspid valve is normal in structure. Tricuspid valve regurgitation is mild to moderate. No evidence of tricuspid stenosis.  Aortic Valve: The aortic valve is calcified. There is mild calcification of the aortic valve. There is mild thickening of the aortic valve. Aortic valve regurgitation is mild. Aortic regurgitation PHT measures 538 msec. Aortic valve sclerosis/calcification is present, without any evidence of aortic stenosis. Aortic valve mean gradient measures 9.7 mmHg. Aortic valve peak gradient measures 18.0 mmHg. Aortic valve area, by VTI measures 1.33 cm.  Pulmonic Valve: The pulmonic valve was normal in structure. Pulmonic valve regurgitation is trivial. No evidence of pulmonic stenosis.  Aorta: The aortic root is normal in size and  structure.  Venous: The inferior vena cava is normal in size with greater than 50% respiratory variability, suggesting right atrial pressure of 3 mmHg.  IAS/Shunts: No atrial level shunt detected by color flow Doppler.   LEFT VENTRICLE PLAX 2D LVIDd:         4.60 cm LVIDs:         3.00 cm   2D Longitudinal Strain LV PW:         1.10 cm   2D Strain GLS Avg:     -12.4 % LV IVS:        1.10 cm LVOT diam:     2.00 cm LV SV:         62 LV SV Index:   38 LVOT Area:  3.14 cm   RIGHT VENTRICLE          IVC RV Basal diam:  3.40 cm  IVC diam: 1.80 cm  LEFT ATRIUM              Index        RIGHT ATRIUM           Index LA diam:        5.00 cm  3.11 cm/m   RA Area:     16.40 cm LA Vol (A2C):   110.0 ml 68.33 ml/m  RA Volume:   41.30 ml  25.65 ml/m LA Vol (A4C):   110.0 ml 68.33 ml/m LA Biplane Vol: 112.0 ml 69.57 ml/m AORTIC VALVE                     PULMONIC VALVE AV Area (Vmax):    1.38 cm      PR End Diast Vel: 7.08 msec AV Area (Vmean):   1.33 cm AV Area (VTI):     1.33 cm AV Vmax:           212.33 cm/s AV Vmean:          142.667 cm/s AV VTI:            0.464 m AV Peak Grad:      18.0 mmHg AV Mean Grad:      9.7 mmHg LVOT Vmax:         93.27 cm/s LVOT Vmean:        60.567 cm/s LVOT VTI:          0.197 m LVOT/AV VTI ratio: 0.42 AI PHT:            538 msec  AORTA Ao Root diam: 2.70 cm Ao Asc diam:  3.70 cm Ao Desc diam: 2.60 cm  MITRAL VALVE                  TRICUSPID VALVE MV Area (PHT): 1.61 cm       TR Peak grad:   30.9 mmHg MV Area VTI:   0.87 cm       TR Vmax:        278.00 cm/s MV Peak grad:  13.8 mmHg MV Mean grad:  7.5 mmHg       SHUNTS MV Vmax:       1.86 m/s       Systemic VTI:  0.20 m MV Vmean:      129.5 cm/s     Systemic Diam: 2.00 cm MR Peak grad:    114.5 mmHg MR Mean grad:    81.0 mmHg MR Vmax:         535.00 cm/s MR Vmean:        431.0 cm/s MR PISA:         3.08 cm MR PISA Eff ROA: 22 mm MR PISA Radius:  0.70 cm MV E velocity: 138.00  cm/s MV A velocity: 130.00 cm/s MV E/A ratio:  1.06  Lamar Fitch MD Electronically signed by Lamar Fitch MD Signature Date/Time: 02/06/2023/8:43:44 AM    Final   MONITORS  LONG TERM MONITOR (3-14 DAYS) 08/01/2022  Narrative Patch Wear Time:  6 days and 19 hours (2024-04-01T15:03:45-0400 to 2024-04-08T10:10:22-0400)  Patient had a min HR of 30 bpm, max HR of 80 bpm, and avg HR of 56 bpm. Predominant underlying rhythm was Sinus Rhythm. Intermittent Bundle Branch Block was present.  There was 1 diary events sinus rhythm 59  bpm  1 Pause occurred nocturnally lasting 3.2 secs (19 bpm).  Isolated SVEs were occasional (1.8%, 9516), SVE Couplets were rare (<1.0%, 117), and SVE Triplets were rare (<1.0%, 17).  There were no episodes of atrial fibrillation or flutter  Isolated VEs were rare (<1.0%, 324), VE Triplets were rare (<1.0%, 6), and no VE Couplets were present.           EKG Interpretation Date/Time:  Thursday May 29 2023 16:29:42 EST Ventricular Rate:  80 PR Interval:  182 QRS Duration:  88 QT Interval:  398 QTC Calculation: 459 R Axis:   64  Text Interpretation: Normal sinus rhythm When compared with ECG of 13-Nov-2022 08:30, unchanged non specific st abnormality Confirmed by Monetta Rogue ([id number]) on 05/29/2023 4:40:33 PM   Recent Labs: 06/03/2022: Hemoglobin 14.2; Magnesium 2.0; Platelets 142 11/13/2022: TSH 1.770 01/01/2023: ALT 16; BUN 6; Creatinine, Ser 0.81; Potassium 3.6; Sodium 142  Recent Lipid Panel No results found for: CHOL, TRIG, HDL, CHOLHDL, VLDL, LDLCALC, LDLDIRECT  Physical Exam:    VS:  BP (!) 144/82   Pulse 80   Ht 5' 2 (1.575 m)   Wt 133 lb (60.3 kg)   SpO2 97%   BMI 24.33 kg/m     Wt Readings from Last 3 Encounters:  05/29/23 133 lb (60.3 kg)  01/01/23 133 lb 8 oz (60.6 kg)  11/13/22 133 lb 12.8 oz (60.7 kg)     GEN: She has become quite small thin well nourished, well developed in no acute distress HEENT:  Normal NECK: No JVD; No carotid bruits LYMPHATICS: No lymphadenopathy CARDIAC: RRR, no murmurs, rubs, gallops RESPIRATORY:  Clear to auscultation without rales, wheezing or rhonchi  ABDOMEN: Soft, non-tender, non-distended MUSCULOSKELETAL:  No edema; No deformity  SKIN: Warm and dry NEUROLOGIC:  Alert and oriented x 3 PSYCHIATRIC:  Normal affect    Signed, Rogue Monetta, MD  05/29/2023 4:57 PM    [facility]

## 2023-05-29 ENCOUNTER — Ambulatory Visit: Payer: Medicare Other | Attending: Cardiology | Admitting: Cardiology

## 2023-05-29 ENCOUNTER — Encounter: Payer: Self-pay | Admitting: Cardiology

## 2023-05-29 VITALS — BP 144/82 | HR 80 | Ht 62.0 in | Wt 133.0 lb

## 2023-05-29 DIAGNOSIS — I110 Hypertensive heart disease with heart failure: Secondary | ICD-10-CM

## 2023-05-29 DIAGNOSIS — I342 Nonrheumatic mitral (valve) stenosis: Secondary | ICD-10-CM

## 2023-05-29 DIAGNOSIS — I484 Atypical atrial flutter: Secondary | ICD-10-CM | POA: Diagnosis not present

## 2023-05-29 DIAGNOSIS — Z79899 Other long term (current) drug therapy: Secondary | ICD-10-CM | POA: Diagnosis not present

## 2023-05-29 DIAGNOSIS — Z7901 Long term (current) use of anticoagulants: Secondary | ICD-10-CM

## 2023-05-29 DIAGNOSIS — I340 Nonrheumatic mitral (valve) insufficiency: Secondary | ICD-10-CM

## 2023-05-29 MED ORDER — APIXABAN 2.5 MG PO TABS: 2.5000 mg | ORAL_TABLET | Freq: Two times a day (BID) | ORAL | 3 refills | Status: AC

## 2023-05-29 MED ORDER — AMIODARONE HCL 200 MG PO TABS: 200.0000 mg | ORAL_TABLET | ORAL | 3 refills | Status: AC

## 2023-05-29 NOTE — Patient Instructions (Signed)
 Medication Instructions:  Your physician has recommended you make the following change in your medication:   START: Eliquis  2.5 mg two times daily  *If you need a refill on your cardiac medications before your next appointment, please call your pharmacy*   Lab Work: None If you have labs (blood work) drawn today and your tests are completely normal, you will receive your results only by: MyChart Message (if you have MyChart) OR A paper copy in the mail If you have any lab test that is abnormal or we need to change your treatment, we will call you to review the results.   Testing/Procedures: None   Follow-Up: At [facility], you and your health needs are our priority.  As part of our continuing mission to provide you with exceptional heart care, we have created designated Provider Care Teams.  These Care Teams include your primary Cardiologist (physician) and Advanced Practice Providers (APPs -  Physician Assistants and Nurse Practitioners) who all work together to provide you with the care you need, when you need it.  We recommend signing up for the patient portal called MyChart.  Sign up information is provided on this After Visit Summary.  MyChart is used to connect with patients for Virtual Visits (Telemedicine).  Patients are able to view lab/test results, encounter notes, upcoming appointments, etc.  Non-urgent messages can be sent to your provider as well.   To learn more about what you can do with MyChart, go to [email or web address].    Your next appointment:   6 month(s)  Provider:   Redell Leiter, MD    Other Instructions None

## 2023-06-09 ENCOUNTER — Encounter: Payer: Self-pay | Admitting: Student

## 2023-06-09 ENCOUNTER — Ambulatory Visit: Payer: Medicare Other | Admitting: Student

## 2023-06-09 VITALS — BP 130/80 | HR 74 | Temp 97.2°F | Resp 18 | Ht 62.0 in | Wt 135.1 lb

## 2023-06-09 DIAGNOSIS — H1032 Unspecified acute conjunctivitis, left eye: Secondary | ICD-10-CM | POA: Insufficient documentation

## 2023-06-09 DIAGNOSIS — N1832 Chronic kidney disease, stage 3b: Secondary | ICD-10-CM

## 2023-06-09 DIAGNOSIS — I484 Atypical atrial flutter: Secondary | ICD-10-CM

## 2023-06-09 DIAGNOSIS — Z0000 Encounter for general adult medical examination without abnormal findings: Secondary | ICD-10-CM | POA: Insufficient documentation

## 2023-06-09 DIAGNOSIS — Z6824 Body mass index (BMI) 24.0-24.9, adult: Secondary | ICD-10-CM

## 2023-06-09 DIAGNOSIS — Z131 Encounter for screening for diabetes mellitus: Secondary | ICD-10-CM | POA: Diagnosis not present

## 2023-06-09 HISTORY — DX: Unspecified acute conjunctivitis, left eye: H10.32

## 2023-06-09 HISTORY — DX: Encounter for general adult medical examination without abnormal findings: Z00.00

## 2023-06-09 MED ORDER — CROMOLYN SODIUM 4 % OP SOLN: OPHTHALMIC | 0 refills | Status: AC

## 2023-06-09 NOTE — Assessment & Plan Note (Signed)
-  CMP  -Continue Farxiga 10 mg

## 2023-06-09 NOTE — Progress Notes (Signed)
 Preventive Screening-Counseling & Management     Marcia Leblanc is a 83 y.o. female who presents for Medicare Annual/Subsequent preventive examination.  HPI   A.flutter: Patient is under the care of Dr. Dulce Sellar with cardiology. She has been tolerating amiodarone 200 mg every other day. She is due for thyroid function today. Overall she has done well she has had no edema shortness of breath palpitation chest pain or syncope. She tolerates her anticoagulant Eliquis 2.5 mg without bleeding. Per Dr. Dulce Sellar, her echocardiogram is reassuring with age she has developed calcific mitral valve disease it is not severe and generally does not lead to valve intervention in her age group.   Arthritis: she reports not having any issues since working out. She may have a tylenol every now and then but otherwise does not take any medications.   Stage 3b CKD: On her most recent labs 5 months ago GFR 73, creatinine 0.81 this is improved from a year ago where her GFR was 49-47.  She is on Farxiga 10 mg daily and denies any complaints or concerns.  Eye irritation: The patient reports left eye itching and mild photophobia, she believes particles of some sort flew into her eye while she was outside.  She has been using over-the-counter saline eyedrops without relief.  She denies matting, but there is tearing and redness.   Are there smokers in your home (other than you)? Yes, her husband who goes out side.   Risk Factors Current exercise habits: Gym/ health club routine includes treadmill and walking on track .  Dietary issues discussed: has a balanced appetite    Depression Screen (Note: if answer to either of the following is "Yes", a more complete depression screening is indicated)   Over the past two weeks, have you felt down, depressed or hopeless? No  Over the past two weeks, have you felt little interest or pleasure in doing things? No  Have you lost interest or pleasure in daily life? No  Do you often  feel hopeless? No  Do you cry easily over simple problems? No  Activities of Daily Living In your present state of health, do you have any difficulty performing the following activities?:  Driving? No Managing money?  No Feeding yourself? No Getting from bed to chair? No Climbing a flight of stairs? No Preparing food and eating?: No Bathing or showering? No Getting dressed: No Getting to the toilet? No Using the toilet:No Moving around from place to place: No In the past year have you fallen or had a near fall?:No   Are you sexually active?  No  Do you have more than one partner?  No  Hearing Difficulties: Yes, uses hearing aids  Do you often ask people to speak up or repeat themselves? Yes Do you experience ringing or noises in your ears? No Do you have difficulty understanding soft or whispered voices? No   Do you feel that you have a problem with memory? No  Do you often misplace items? No  Do you feel safe at home?  Yes  Cognitive Testing  Alert? Yes  Normal Appearance?Yes  Oriented to person? Yes  Place? Yes   Time? Yes  Recall of three objects?  Yes  Can perform simple calculations? Yes  Displays appropriate judgment?Yes  Can read the correct time from a watch face?Yes  Fall Risk Prevention  Any stairs in or around the home? No  If so, are there any without handrails? Yes  Home free of loose  throw rugs in walkways, pet beds, electrical cords, etc? Yes  Adequate lighting in your home to reduce risk of falls? Yes  Use of a cane, walker or w/c? No     Advanced Directives have been discussed with the patient? Yes   List the Names of Other Physician/Practitioners you currently use: Patient Care Team: Dulcemaria Bula, Luetta Nutting, NP as PCP - General (Internal Medicine)    Past Medical History:  Diagnosis Date   Arrhythmia    Arthritis    Atrial flutter (HCC)    CHF (congestive heart failure) (HCC)    Chronic anticoagulation 12/18/2018   CKD (chronic kidney disease)  05/31/2022   Colitis 03/13/2022   Diastolic heart failure (HCC)    Dizziness 01/01/2023   Dyspnea on exertion    Mitral regurgitation    Osteopenia 05/31/2022   Seasonal allergic rhinitis 10/04/2022   Status post total replacement of left hip 09/23/2014    Past Surgical History:  Procedure Laterality Date   CARPAL TUNNEL RELEASE Bilateral 04/22/1964   JOINT REPLACEMENT Right 04/22/2010   hip   TOTAL HIP ARTHROPLASTY Left 09/23/2014   Procedure: TOTAL HIP ARTHROPLASTY ANTERIOR APPROACH;  Surgeon: Eldred Manges, MD;  Location: [facility] OR;  Service: Orthopedics;  Laterality: Left;   TUBAL LIGATION  04/22/1964      Current Medications  Current Outpatient Medications  Medication Sig Dispense Refill   amiodarone (PACERONE) 200 MG tablet Take 1 tablet (200 mg total) by mouth every other day. 45 tablet 3   amoxicillin-clavulanate (AUGMENTIN) 500-125 MG tablet Take 1 tablet by mouth in the morning and at bedtime. 14 tablet 0   apixaban (ELIQUIS) 2.5 MG TABS tablet Take 1 tablet (2.5 mg total) by mouth 2 (two) times daily. 180 tablet 3   Ascorbic Acid (VITAMIN C) 500 MG CAPS Take 500 mg by mouth daily.     Cholecalciferol (VITAMIN D) 125 MCG (5000 UT) CAPS Take 5,000 Units by mouth daily.     dapagliflozin propanediol (FARXIGA) 10 MG TABS tablet Take 10 mg by mouth daily.     fluticasone (FLONASE) 50 MCG/ACT nasal spray USE 1 SPRAY IN EACH NOSTRIL EVERY DAY 16 g 5   montelukast (SINGULAIR) 10 MG tablet TAKE 1 TABLET EVERY DAY 90 tablet 3   Multiple Vitamin (MULTIVITAMIN) tablet Take 1 tablet by mouth daily.     Omega-3 Fatty Acids (FISH OIL) 1000 MG CAPS Take 1 capsule by mouth daily.     Probiotic Product (PROBIOTIC DAILY PO) Take 2 capsules by mouth daily.     Propylene Glycol (SYSTANE BALANCE OP) Apply 1 drop to eye as needed (dry eyes).     No current facility-administered medications for this visit.    Allergies Patient has no known allergies.   Social History Social History    Tobacco Use   Smoking status: Former    Current packs/day: 0.00    Average packs/day: 0.5 packs/day for 50.0 years (25.0 ttl pk-yrs)    Types: Cigarettes    Start date: 09/12/1962    Quit date: 09/11/2012    Years since quitting: 10.7    Passive exposure: Past   Smokeless tobacco: Never  Substance Use Topics   Alcohol use: Yes    Comment: dark wine every night small     Review of Systems Review of Systems  Constitutional: Negative.   HENT: Negative.    Eyes:  Positive for pain and redness.  Respiratory: Negative.    Cardiovascular: Negative.   Gastrointestinal: Negative.  Genitourinary: Negative.   Musculoskeletal: Negative.   Skin: Negative.   Neurological: Negative.   Endo/Heme/Allergies: Negative.   Psychiatric/Behavioral: Negative.       Physical Exam:      Body mass index is 24.71 kg/m. BP 130/80 (BP Location: Left Arm, Patient Position: Sitting, Cuff Size: Normal)   Pulse 74   Temp (!) 97.2 F (36.2 C)   Resp 18   Ht 5\' 2"  (1.575 m)   Wt 135 lb 2 oz (61.3 kg)   SpO2 97%   BMI 24.71 kg/m   Physical Exam Vitals reviewed.  Constitutional:      Appearance: Normal appearance. She is normal weight.  HENT:     Head: Normocephalic and atraumatic.  Eyes:     General: Lids are normal.        Left eye: No foreign body.     Extraocular Movements: Extraocular movements intact.     Conjunctiva/sclera:     Left eye: Left conjunctiva is injected. Chemosis present.  Cardiovascular:     Rate and Rhythm: Normal rate and regular rhythm.     Pulses: Normal pulses.     Heart sounds: Normal heart sounds.  Pulmonary:     Effort: Pulmonary effort is normal.     Breath sounds: Normal breath sounds.  Abdominal:     General: Abdomen is flat.     Palpations: Abdomen is soft.  Musculoskeletal:        General: Normal range of motion.  Skin:    General: Skin is warm and dry.  Neurological:     General: No focal deficit present.     Mental Status: She is alert and  oriented to person, place, and time. Mental status is at baseline.  Psychiatric:        Mood and Affect: Mood normal.        Behavior: Behavior normal.        Plan:    Medicare annual wellness visit, subsequent  Stage 3b chronic kidney disease (HCC) Assessment & Plan: -CMP  -Continue Farxiga 10 mg   Orders: -     CMP14 + Anion Gap -     CBC with Differential/Platelet -     Lipid panel  Atypical atrial flutter (HCC) Assessment & Plan: On chart review her last visit with Dr. Dulce Sellar cardiology patient was in normal sinus rhythm taking amiodarone every other day.  Anticoagulation therapy is Eliquis patient is stable denies any bruising or free bleeding.  -Complete thyroid panel drawn today -CBC  Orders: -     TSH+T4F+T3Free  Acute bacterial conjunctivitis of left eye Assessment & Plan: This appears to be more allergic versus bacterial conjunctivitis.    - cromolyn ophthalmic-instill 1 to 2 drops every 4-6 hours   Screening for diabetes mellitus (DM) -     Hemoglobin A1c  Other orders -     Cromolyn Sodium; Instill 1 to 2 drops in affected eye every 4-6 hours for 5 days.  Dispense: 10 mL; Refill: 0    During the course of the visit the patient was educated and counseled about appropriate screening and preventive services including:    Diet review for nutrition referral? Yes ____  Not Indicated __x__   Patient Instructions (the written plan) was given to the patient.    Prevention   Medicare Attestation I have personally reviewed: The patient's medical and social history Their use of alcohol, tobacco or illicit drugs Their current medications and supplements The patient's functional ability including ADLs,fall  risks, home safety risks, cognitive, and hearing and visual impairment Diet and physical activities Evidence for depression or mood disorders  The patient's weight, height, and BMI have been recorded in the chart.  I have made referrals, counseling,  and provided education to the patient based on review of the above and I have provided the patient with a written personalized care plan for preventive services.     Edwena Blow, NP   06/09/2023

## 2023-06-09 NOTE — Assessment & Plan Note (Signed)
 On chart review her last visit with Dr. Dulce Sellar cardiology patient was in normal sinus rhythm taking amiodarone every other day.  Anticoagulation therapy is Eliquis patient is stable denies any bruising or free bleeding.  -Complete thyroid panel drawn today -CBC

## 2023-06-09 NOTE — Assessment & Plan Note (Signed)
 This appears to be more allergic versus bacterial conjunctivitis.    - cromolyn ophthalmic-instill 1 to 2 drops every 4-6 hours

## 2023-06-10 ENCOUNTER — Encounter: Payer: Self-pay | Admitting: Cardiology

## 2023-06-10 LAB — CBC WITH DIFFERENTIAL/PLATELET
Basophils Absolute: 0.1 10*3/uL (ref 0.0–0.2)
Basos: 1 %
EOS (ABSOLUTE): 0.2 10*3/uL (ref 0.0–0.4)
Eos: 3 %
Hematocrit: 45.9 % (ref 34.0–46.6)
Hemoglobin: 15 g/dL (ref 11.1–15.9)
Immature Grans (Abs): 0 10*3/uL (ref 0.0–0.1)
Immature Granulocytes: 0 %
Lymphocytes Absolute: 1.3 10*3/uL (ref 0.7–3.1)
Lymphs: 25 %
MCH: 29.9 pg (ref 26.6–33.0)
MCHC: 32.7 g/dL (ref 31.5–35.7)
MCV: 92 fL (ref 79–97)
Monocytes Absolute: 0.3 10*3/uL (ref 0.1–0.9)
Monocytes: 5 %
Neutrophils Absolute: 3.3 10*3/uL (ref 1.4–7.0)
Neutrophils: 66 %
Platelets: 144 10*3/uL — ABNORMAL LOW (ref 150–450)
RBC: 5.01 x10E6/uL (ref 3.77–5.28)
RDW: 13.5 % (ref 11.7–15.4)
WBC: 5 10*3/uL (ref 3.4–10.8)

## 2023-06-10 LAB — LIPID PANEL
Chol/HDL Ratio: 2.9 {ratio} (ref 0.0–4.4)
Cholesterol, Total: 189 mg/dL (ref 100–199)
HDL: 65 mg/dL (ref >39)
LDL Chol Calc (NIH): 109 mg/dL — ABNORMAL HIGH (ref 0–99)
Triglycerides: 81 mg/dL (ref 0–149)
VLDL Cholesterol Cal: 15 mg/dL (ref 5–40)

## 2023-06-10 LAB — CMP14 + ANION GAP
ALT: 14 [IU]/L (ref 0–32)
AST: 20 [IU]/L (ref 0–40)
Albumin: 4.3 g/dL (ref 3.7–4.7)
Alkaline Phosphatase: 66 [IU]/L (ref 44–121)
Anion Gap: 12 mmol/L (ref 10.0–18.0)
BUN/Creatinine Ratio: 14 (ref 12–28)
BUN: 16 mg/dL (ref 8–27)
Bilirubin Total: 0.4 mg/dL (ref 0.0–1.2)
CO2: 23 mmol/L (ref 20–29)
Calcium: 9.4 mg/dL (ref 8.7–10.3)
Chloride: 108 mmol/L — ABNORMAL HIGH (ref 96–106)
Creatinine, Ser: 1.15 mg/dL — ABNORMAL HIGH (ref 0.57–1.00)
Globulin, Total: 2.3 g/dL (ref 1.5–4.5)
Glucose: 88 mg/dL (ref 70–99)
Potassium: 4.5 mmol/L (ref 3.5–5.2)
Sodium: 143 mmol/L (ref 134–144)
Total Protein: 6.6 g/dL (ref 6.0–8.5)
eGFR: 48 mL/min/{1.73_m2} — ABNORMAL LOW (ref >59)

## 2023-06-10 LAB — HEMOGLOBIN A1C
Est. average glucose Bld gHb Est-mCnc: 103 mg/dL
Hgb A1c MFr Bld: 5.2 % (ref 4.8–5.6)

## 2023-06-10 LAB — TSH+T4F+T3FREE
Free T4: 1.55 ng/dL (ref 0.82–1.77)
T3, Free: 2.1 pg/mL (ref 2.0–4.4)
TSH: 1.58 u[IU]/mL (ref 0.450–4.500)

## 2023-06-10 NOTE — Progress Notes (Signed)
 Patient has been notified

## 2023-06-10 NOTE — Progress Notes (Signed)
 I have reviewed your lab results. Kidney, blood counts, cholesterol, and thyroid levels are are stable and you are not diabetic. Continue medications as indicated.

## 2023-07-09 ENCOUNTER — Telehealth: Payer: Self-pay | Admitting: Pharmacy Technician

## 2023-07-09 ENCOUNTER — Telehealth: Payer: Self-pay

## 2023-07-09 ENCOUNTER — Encounter: Payer: Self-pay | Admitting: Pharmacy Technician

## 2023-07-09 NOTE — Telephone Encounter (Signed)
 PAP: Patient application pending due to income documents . The scan is in media for her pap, just missing the income documents. I sent a mychart message to the pt

## 2023-07-09 NOTE — Telephone Encounter (Signed)
 I have faxed over everything we had for her application.

## 2023-07-10 ENCOUNTER — Telehealth: Payer: Self-pay | Admitting: Cardiology

## 2023-07-10 NOTE — Telephone Encounter (Signed)
 Called patient to talk to her about the application and giving her some samples. Patient did not answer. Left VM for her to call back.

## 2023-07-10 NOTE — Telephone Encounter (Signed)
 Patient came in with a form from [facility] for her Eliquis 2.5mg , she said she's dropped off 3 forms previously and was upset that her papers were "lost" and that she was concerned that someone had her SSN etc.. We made a copy of the [facility] form and stuck it in Dr. Thersa Salt box. We let patient know Dr. Dulce Sellar won't be back in the office til March 31st. We suggested her to fax over her income papers to [employer] since theres a note in her chart saying that those were needed for her form to be completed. Patient states she is going to contact [employer] and let them know that Dr. Dulce Sellar is out of the office til March 31st. She's concerned that she hasn't taken this medication since it's been prescribed back in Feb because it was going to cost her $500 OOP. CB #  [phone]

## 2023-07-14 NOTE — Telephone Encounter (Signed)
   Faxed bms what we had -which included the prescription portion

## 2023-07-14 NOTE — Telephone Encounter (Signed)
 Per telephone call on 07/09/2023, Judeth Cornfield with the Med assist team has reached out to the patient through MyChart. She sent her portion to [facility] without the providers information. She asked Judeth Cornfield to send what she had.

## 2023-07-17 NOTE — Telephone Encounter (Signed)
 Patient came in for samples. Checked the sample closet and unfortunately we do not have any 2.5mg  samples. I explained this to the patient and she understood. She also mentioned that she sent her application to [facility] but left the providers portion blank. I told her that I would send everything directly to [facility]. Faxed patients application and all documents that were given to [place] 07/17/2023. Patient was understanding and had no further questions at this time.

## 2023-07-21 NOTE — Telephone Encounter (Signed)
   PAP: Patient has been denied for patient assistance by [employer] ([employer]) due to has not spent 3% of the out of pocket expenses

## 2023-07-28 ENCOUNTER — Other Ambulatory Visit: Payer: Self-pay

## 2023-07-28 MED ORDER — DAPAGLIFLOZIN PROPANEDIOL 10 MG PO TABS: 10.0000 mg | ORAL_TABLET | Freq: Every day | ORAL | 1 refills | Status: DC

## 2023-07-28 MED ORDER — DAPAGLIFLOZIN PROPANEDIOL 10 MG PO TABS: 10.0000 mg | ORAL_TABLET | Freq: Every day | ORAL | 0 refills | Status: DC

## 2023-08-04 ENCOUNTER — Other Ambulatory Visit: Payer: Self-pay

## 2023-08-04 DIAGNOSIS — J302 Other seasonal allergic rhinitis: Secondary | ICD-10-CM

## 2023-08-04 MED ORDER — MONTELUKAST SODIUM 10 MG PO TABS: 10.0000 mg | ORAL_TABLET | Freq: Every day | ORAL | 3 refills | Status: DC

## 2023-08-06 ENCOUNTER — Other Ambulatory Visit: Payer: Self-pay | Admitting: Student

## 2023-08-06 DIAGNOSIS — J302 Other seasonal allergic rhinitis: Secondary | ICD-10-CM

## 2023-08-06 MED ORDER — MONTELUKAST SODIUM 10 MG PO TABS: 10.0000 mg | ORAL_TABLET | Freq: Every day | ORAL | 3 refills | Status: AC

## 2023-10-21 DIAGNOSIS — H00015 Hordeolum externum left lower eyelid: Secondary | ICD-10-CM | POA: Diagnosis not present

## 2023-10-29 ENCOUNTER — Encounter: Payer: Self-pay | Admitting: Internal Medicine

## 2023-10-29 ENCOUNTER — Ambulatory Visit: Admitting: Internal Medicine

## 2023-10-29 VITALS — BP 152/80 | HR 77 | Temp 97.8°F | Resp 18 | Ht 62.0 in | Wt 131.5 lb

## 2023-10-29 DIAGNOSIS — L309 Dermatitis, unspecified: Secondary | ICD-10-CM

## 2023-10-29 DIAGNOSIS — I10 Essential (primary) hypertension: Secondary | ICD-10-CM | POA: Insufficient documentation

## 2023-10-29 DIAGNOSIS — L219 Seborrheic dermatitis, unspecified: Secondary | ICD-10-CM | POA: Insufficient documentation

## 2023-10-29 DIAGNOSIS — N1832 Chronic kidney disease, stage 3b: Secondary | ICD-10-CM | POA: Diagnosis not present

## 2023-10-29 HISTORY — DX: Seborrheic dermatitis, unspecified: L21.9

## 2023-10-29 HISTORY — DX: Essential (primary) hypertension: I10

## 2023-10-29 NOTE — Addendum Note (Signed)
 Addended by: Dory Demont on: 10/29/2023 09:51 AM   Modules accepted: Orders

## 2023-10-29 NOTE — Assessment & Plan Note (Signed)
 She does not take any blood pressure medicine but will continue to monitor blood pressure at home.

## 2023-10-29 NOTE — Assessment & Plan Note (Signed)
 She has CKD 3A and she take Farxiga  10 mg daily will continue to monitor.

## 2023-10-29 NOTE — Assessment & Plan Note (Signed)
 There are multiple areas of dermatitis including seborrheic dermatitis on her back I will refer her to see dermatologist in [place].

## 2023-10-29 NOTE — Progress Notes (Signed)
   Office Visit  Subjective   Patient ID: Marcia Leblanc   DOB: 05/07/1940   Age: 83 y.o.   MRN: [id number]   Chief Complaint Chief Complaint  Patient presents with   office visit    Patient is for dermatology referral      History of Present Illness 83 years old female has multiple area as of bumps and rash on her back including seborrheic dermatitis and she wanted to be referred to a dermatologist in [place] as she is not satisfied with dermatology clinic here.   Her son is goes to [facility] dermatology in [place] that is what she wanted to be referred.    She has a history of atrial fibrillation and she take amiodarone  every other day and Eliquis  2.5 mg twice a day.    She also has a CKD 3 a/b where her GFR has been stable.  Her blood pressure is elevated today but she is not taking any blood pressure medicine and she says that she get anxious and nervous coming here.  She says that she monitor her blood pressure at home.  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   Respiratory: Negative.    Cardiovascular: Negative.   Skin:  Positive for rash.       Objective:    Vitals BP (!) 152/80   Pulse 77   Temp 97.8 F (36.6 C)   Resp 18   Ht 5' 2 (1.575 m)   Wt 131 lb 8 oz (59.6 kg)   SpO2 98%   BMI 24.05 kg/m    Physical Examination Physical Exam Constitutional:      Appearance: Normal appearance.  Cardiovascular:     Rate and  Rhythm: Normal rate and regular rhythm.     Heart sounds: Normal heart sounds.  Pulmonary:     Effort: Pulmonary effort is normal.     Breath sounds: Normal breath sounds.  Skin:    Findings: Lesion and rash present.  Neurological:     Mental Status: She is alert.        Assessment & Plan:   Hypertension   She does not take any blood pressure medicine but will continue to monitor blood pressure at home.  Seborrheic dermatitis   There are multiple areas of dermatitis including seborrheic dermatitis on her back I will refer her to see dermatologist in [place].  CKD (chronic kidney disease)   She has CKD 3A and she take Farxiga  10 mg daily will continue to monitor.    Return in about 3 months (around 01/29/2024).   Roetta Dare, MD

## 2023-11-23 NOTE — Progress Notes (Unsigned)
 Cardiology Office Note:    Date:  11/25/2023   ID:  Marcia Leblanc, DOB Dec 10, 1940, MRN [id number]  PCP:  Caleen Dirks, MD  Cardiologist:  Redell Leiter, MD    Referring MD: Monty Daphne CROME, NP    ASSESSMENT:    1. Atypical atrial flutter (HCC)   2. On amiodarone  therapy   3. Chronic anticoagulation   4. Hypertensive heart disease with heart failure (HCC)   5. Nonrheumatic mitral valve regurgitation   6. Nonrheumatic mitral valve stenosis   7. Nonrheumatic aortic valve insufficiency    PLAN:    In order of problems listed above:  She continues to do well with low-dose amiodarone  taking the equivalent of 100 mg daily maintaining sinus rhythm continue that along with her reduced dose anticoagulant with age and body weight Stable heart failure currently not on loop diuretic no fluid overload and BP at target.  Note her ejection fraction has normalized Stable valvular heart disease consider repeat echocardiogram at her next visit   Next appointment: 6 months   Medication Adjustments/Labs and Tests Ordered: Current medicines are reviewed at length with the patient today.  Concerns regarding medicines are outlined above.  No orders of the defined types were placed in this encounter.  No orders of the defined types were placed in this encounter.    History of Present Illness:    Marcia Leblanc is a 83 y.o. female with a hx of atypical atrial flutter maintaining sinus rhythm with low-dose amiodarone  therapy chronic anticoagulation and hypertensive heart disease with heart failure last seen 05/29/2023.  Other problems include mild to moderate calcific mitral stenosis and moderate mitral regurgitation and mild aortic regurgitation.  Compliance with diet, lifestyle and medications: Cardiology perspective she is doing well no recurrent atrial fibrillation compliant with her medications and no bleeding complication of her anticoagulant She has had no chest discomfort edema shortness of  breath palpitation or syncope  Past Medical History:  Diagnosis Date   Arrhythmia    Arthritis    Atrial flutter (HCC)    CHF (congestive heart failure) (HCC)    Chronic anticoagulation 12/18/2018   CKD (chronic kidney disease) 05/31/2022   Colitis 03/13/2022   Diastolic heart failure (HCC)    Dizziness 01/01/2023   Dyspnea on exertion    Mitral regurgitation    Osteopenia 05/31/2022   Seasonal allergic rhinitis 10/04/2022   Status post total replacement of left hip 09/23/2014    Current Medications: Current Meds  Medication Sig   amiodarone  (PACERONE ) 200 MG tablet Take 1 tablet (200 mg total) by mouth every other day.   apixaban  (ELIQUIS ) 2.5 MG TABS tablet Take 1 tablet (2.5 mg total) by mouth 2 (two) times daily.   Ascorbic Acid (VITAMIN C) 500 MG CAPS Take 500 mg by mouth daily.   Cholecalciferol (VITAMIN D) 125 MCG (5000 UT) CAPS Take 5,000 Units by mouth daily.   cromolyn  (OPTICROM ) 4 % ophthalmic solution Instill 1 to 2 drops in affected eye every 4-6 hours for 5 days.   dapagliflozin  propanediol (FARXIGA ) 10 MG TABS tablet Take 1 tablet (10 mg total) by mouth daily.   fluticasone  (FLONASE ) 50 MCG/ACT nasal spray USE 1 SPRAY IN EACH NOSTRIL EVERY DAY   montelukast  (SINGULAIR ) 10 MG tablet Take 1 tablet (10 mg total) by mouth daily.   Multiple Vitamin (MULTIVITAMIN) tablet Take 1 tablet by mouth daily.   Omega-3 Fatty Acids (FISH OIL) 1000 MG CAPS Take 1 capsule by mouth daily.   Probiotic Product (PROBIOTIC  DAILY PO) Take 2 capsules by mouth daily.   Propylene Glycol (SYSTANE BALANCE OP) Apply 1 drop to eye as needed (dry eyes).      EKGs/Labs/Other Studies Reviewed:    The following studies were reviewed today:  Cardiac Studies & Procedures   ______________________________________________________________________________________________     ECHOCARDIOGRAM  ECHOCARDIOGRAM COMPLETE 02/05/2023  Narrative ECHOCARDIOGRAM REPORT    Patient Name:   Marcia Leblanc Date of Exam: 02/05/2023 Medical Rec #:  [id number]     Height:       62.0 in Accession #:    [id number]    Weight:       133.5 lb Date of Birth:  08-15-1940     BSA:          1.610 m Patient Age:    82 years      BP:           146/82 mmHg Patient Gender: F             HR:           70 bpm. Exam Location:  [facility]  Procedure: 2D Echo, Cardiac Doppler, Color Doppler and Strain Analysis  Indications:    Atypical atrial flutter (HCC) [I48.4 (ICD-10-CM)]; On amiodarone  therapy [Z79.899 (ICD-10-CM)]; Chronic anticoagulation [Z79.01 (ICD-10-CM)]; Hypertensive heart disease with heart failure (HCC) [I11.0 (ICD-10-CM)]; Nonrheumatic mitral valve regurgitation [I34.0 (ICD-10-CM)]  History:        Patient has prior history of Echocardiogram examinations, most recent 05/27/2019. Arrythmias:Atrial Flutter; Signs/Symptoms:Hypertensive Heart Disease.  Sonographer:    Lynwood Silvas RDCS Referring Phys: [id number] Talon Regala J Alexcis Bicking  IMPRESSIONS   1. Left ventricular ejection fraction, by estimation, is 60 to 65%. The left ventricle has normal function. The left ventricle has no regional wall motion abnormalities. Left ventricular diastolic parameters are indeterminate. The average left ventricular global longitudinal strain is -12.4 %. The global longitudinal strain is abnormal. 2. Right ventricular systolic function is normal. The right ventricular size is normal. There is normal pulmonary artery systolic pressure. 3. Left atrial size was severely dilated. 4. ERO 0.22 cm2, MR Volume39 cc, MVA 0.83 cm2, mean gradient 7 mmHg. The mitral valve is degenerative. Moderate mitral valve regurgitation. Moderate to severe mitral stenosis. The mean mitral valve gradient is 7.5 mmHg. 5. Tricuspid valve regurgitation is mild to moderate. 6. The aortic valve is calcified. There is mild calcification of the aortic valve. There is mild thickening of the aortic valve. Aortic valve regurgitation is mild. Aortic valve  sclerosis/calcification is present, without any evidence of aortic stenosis. 7. The inferior vena cava is normal in size with greater than 50% respiratory variability, suggesting right atrial pressure of 3 mmHg.  FINDINGS Left Ventricle: Left ventricular ejection fraction, by estimation, is 60 to 65%. The left ventricle has normal function. The left ventricle has no regional wall motion abnormalities. The average left ventricular global longitudinal strain is -12.4 %. The global longitudinal strain is abnormal. The left ventricular internal cavity size was normal in size. There is borderline left ventricular hypertrophy. Left ventricular diastolic parameters are indeterminate.  Right Ventricle: The right ventricular size is normal. No increase in right ventricular wall thickness. Right ventricular systolic function is normal. There is normal pulmonary artery systolic pressure. The tricuspid regurgitant velocity is 2.78 m/s, and with an assumed right atrial pressure of 3 mmHg, the estimated right ventricular systolic pressure is 33.9 mmHg.  Left Atrium: Left atrial size was severely dilated.  Right Atrium: Right atrial size was normal in size.  Pericardium: There is no evidence of pericardial effusion.  Mitral Valve: ERO 0.22 cm2, MR Volume39 cc, MVA 0.83 cm2, mean gradient 7 mmHg. The mitral valve is degenerative in appearance. There is mild thickening of the mitral valve leaflet(s). Moderate mitral valve regurgitation. Moderate to severe mitral valve stenosis. MV peak gradient, 13.8 mmHg. The mean mitral valve gradient is 7.5 mmHg.  Tricuspid Valve: The tricuspid valve is normal in structure. Tricuspid valve regurgitation is mild to moderate. No evidence of tricuspid stenosis.  Aortic Valve: The aortic valve is calcified. There is mild calcification of the aortic valve. There is mild thickening of the aortic valve. Aortic valve regurgitation is mild. Aortic regurgitation PHT measures 538 msec.  Aortic valve sclerosis/calcification is present, without any evidence of aortic stenosis. Aortic valve mean gradient measures 9.7 mmHg. Aortic valve peak gradient measures 18.0 mmHg. Aortic valve area, by VTI measures 1.33 cm.  Pulmonic Valve: The pulmonic valve was normal in structure. Pulmonic valve regurgitation is trivial. No evidence of pulmonic stenosis.  Aorta: The aortic root is normal in size and structure.  Venous: The inferior vena cava is normal in size with greater than 50% respiratory variability, suggesting right atrial pressure of 3 mmHg.  IAS/Shunts: No atrial level shunt detected by color flow Doppler.   LEFT VENTRICLE PLAX 2D LVIDd:         4.60 cm LVIDs:         3.00 cm   2D Longitudinal Strain LV PW:         1.10 cm   2D Strain GLS Avg:     -12.4 % LV IVS:        1.10 cm LVOT diam:     2.00 cm LV SV:         62 LV SV Index:   38 LVOT Area:     3.14 cm   RIGHT VENTRICLE          IVC RV Basal diam:  3.40 cm  IVC diam: 1.80 cm  LEFT ATRIUM              Index        RIGHT ATRIUM           Index LA diam:        5.00 cm  3.11 cm/m   RA Area:     16.40 cm LA Vol (A2C):   110.0 ml 68.33 ml/m  RA Volume:   41.30 ml  25.65 ml/m LA Vol (A4C):   110.0 ml 68.33 ml/m LA Biplane Vol: 112.0 ml 69.57 ml/m AORTIC VALVE                     PULMONIC VALVE AV Area (Vmax):    1.38 cm      PR End Diast Vel: 7.08 msec AV Area (Vmean):   1.33 cm AV Area (VTI):     1.33 cm AV Vmax:           212.33 cm/s AV Vmean:          142.667 cm/s AV VTI:            0.464 m AV Peak Grad:      18.0 mmHg AV Mean Grad:      9.7 mmHg LVOT Vmax:         93.27 cm/s LVOT Vmean:        60.567 cm/s LVOT VTI:          0.197 m LVOT/AV VTI  ratio: 0.42 AI PHT:            538 msec  AORTA Ao Root diam: 2.70 cm Ao Asc diam:  3.70 cm Ao Desc diam: 2.60 cm  MITRAL VALVE                  TRICUSPID VALVE MV Area (PHT): 1.61 cm       TR Peak grad:   30.9 mmHg MV Area VTI:   0.87 cm        TR Vmax:        278.00 cm/s MV Peak grad:  13.8 mmHg MV Mean grad:  7.5 mmHg       SHUNTS MV Vmax:       1.86 m/s       Systemic VTI:  0.20 m MV Vmean:      129.5 cm/s     Systemic Diam: 2.00 cm MR Peak grad:    114.5 mmHg MR Mean grad:    81.0 mmHg MR Vmax:         535.00 cm/s MR Vmean:        431.0 cm/s MR PISA:         3.08 cm MR PISA Eff ROA: 22 mm MR PISA Radius:  0.70 cm MV E velocity: 138.00 cm/s MV A velocity: 130.00 cm/s MV E/A ratio:  1.06  Lamar Fitch MD Electronically signed by Lamar Fitch MD Signature Date/Time: 02/06/2023/8:43:44 AM    Final    MONITORS  LONG TERM MONITOR (3-14 DAYS) 08/01/2022  Narrative Patch Wear Time:  6 days and 19 hours (2024-04-01T15:03:45-0400 to 2024-04-08T10:10:22-0400)  Patient had a min HR of 30 bpm, max HR of 80 bpm, and avg HR of 56 bpm. Predominant underlying rhythm was Sinus Rhythm. Intermittent Bundle Branch Block was present.  There was 1 diary events sinus rhythm 59 bpm  1 Pause occurred nocturnally lasting 3.2 secs (19 bpm).  Isolated SVEs were occasional (1.8%, 9516), SVE Couplets were rare (<1.0%, 117), and SVE Triplets were rare (<1.0%, 17).  There were no episodes of atrial fibrillation or flutter  Isolated VEs were rare (<1.0%, 324), VE Triplets were rare (<1.0%, 6), and no VE Couplets were present.       ______________________________________________________________________________________________          Recent Labs: 06/09/2023: ALT 14; BUN 16; Creatinine, Ser 1.15; Hemoglobin 15.0; Platelets 144; Potassium 4.5; Sodium 143; TSH 1.580  Recent Lipid Panel    Component Value Date/Time   CHOL 189 06/09/2023 1010   TRIG 81 06/09/2023 1010   HDL 65 06/09/2023 1010   CHOLHDL 2.9 06/09/2023 1010   LDLCALC 109 (H) 06/09/2023 1010    Physical Exam:    VS:  BP (!) 140/80   Pulse 87   Ht 5' 2 (1.575 m)   Wt 130 lb 9.6 oz (59.2 kg)   SpO2 99%   BMI 23.89 kg/m     Wt Readings from Last 3  Encounters:  11/25/23 130 lb 9.6 oz (59.2 kg)  11/24/23 129 lb 4 oz (58.6 kg)  10/29/23 131 lb 8 oz (59.6 kg)     GEN:  Well nourished, well developed in no acute distress HEENT: Normal NECK: No JVD; No carotid bruits LYMPHATICS: No lymphadenopathy CARDIAC: RRR, no murmurs, rubs, gallops RESPIRATORY:  Clear to auscultation without rales, wheezing or rhonchi  ABDOMEN: Soft, non-tender, non-distended MUSCULOSKELETAL:  No edema; No deformity  SKIN: Warm and dry NEUROLOGIC:  Alert and oriented x 3 PSYCHIATRIC:  Normal  affect    Signed, Redell Leiter, MD  11/25/2023 11:54 AM    [facility]

## 2023-11-24 ENCOUNTER — Encounter: Payer: Self-pay | Admitting: Internal Medicine

## 2023-11-24 ENCOUNTER — Ambulatory Visit: Admitting: Internal Medicine

## 2023-11-24 VITALS — BP 124/70 | HR 69 | Temp 98.0°F | Resp 18 | Ht 62.0 in | Wt 129.2 lb

## 2023-11-24 DIAGNOSIS — J310 Chronic rhinitis: Secondary | ICD-10-CM

## 2023-11-24 DIAGNOSIS — R059 Cough, unspecified: Secondary | ICD-10-CM

## 2023-11-24 HISTORY — DX: Chronic rhinitis: J31.0

## 2023-11-24 HISTORY — DX: Cough, unspecified: R05.9

## 2023-11-24 LAB — POC COVID19 BINAXNOW: SARS Coronavirus 2 Ag: NEGATIVE

## 2023-11-24 NOTE — Progress Notes (Signed)
   Acute Office Visit  Subjective:     Patient ID: Marcia Leblanc, female    DOB: 04-Apr-1941, 83 y.o.   MRN: [id number]  Chief Complaint  Patient presents with   Covid test    Office visit    HPI Patient is in today for stuffy and runny nose for 2 days.  She says that she has seasonal allergies and she was working more outside lately.  She also has not used Singulair  for a while now.  She denies having any tiredness fatigue, fever or chills.  She says that today she is feeling better.  Her son wanted her to be tested for COVID.  Review of Systems  Constitutional: Negative.   HENT:  Positive for congestion.   Respiratory: Negative.    Cardiovascular: Negative.         Objective:    BP 124/70   Pulse 69   Temp 98 F (36.7 C)   Resp 18   Ht 5' 2 (1.575 m)   Wt 129 lb 4 oz (58.6 kg)   SpO2 (!) 80%   PF 69 L/min   BMI 23.64 kg/m    Physical Exam Constitutional:      Appearance: Normal appearance.  Cardiovascular:     Rate and Rhythm: Normal rate and regular rhythm.     Heart sounds: Normal heart sounds.  Pulmonary:     Effort: Pulmonary effort is normal.     Breath sounds: Normal breath sounds.  Neurological:     General: No focal deficit present.     Mental Status: She is alert.     Results for orders placed or performed in visit on 11/24/23  POC COVID-19 BinaxNow  Result Value Ref Range   SARS Coronavirus 2 Ag Negative Negative        Assessment & Plan:   Problem List Items Addressed This Visit       Respiratory   Rhinitis     Her rapid COVID test is negative I have suggested to restart taking Singulair  10 mg daily.  If he continued to have runny nose we may need to repeat COVID test on Wednesday.        Other   Cough - Primary   Relevant Orders   POC COVID-19 BinaxNow (Completed)    No orders of the defined types were placed in this encounter.   No follow-ups on file.  Roetta Dare, MD

## 2023-11-24 NOTE — Assessment & Plan Note (Signed)
 Her rapid COVID test is negative I have suggested to restart taking Singulair  10 mg daily.  If he continued to have runny nose we may need to repeat COVID test on Wednesday.

## 2023-11-25 ENCOUNTER — Ambulatory Visit: Attending: Cardiology | Admitting: Cardiology

## 2023-11-25 ENCOUNTER — Other Ambulatory Visit: Payer: Self-pay

## 2023-11-25 VITALS — BP 140/80 | HR 87 | Ht 62.0 in | Wt 130.6 lb

## 2023-11-25 DIAGNOSIS — I110 Hypertensive heart disease with heart failure: Secondary | ICD-10-CM

## 2023-11-25 DIAGNOSIS — Z7901 Long term (current) use of anticoagulants: Secondary | ICD-10-CM | POA: Diagnosis not present

## 2023-11-25 DIAGNOSIS — I484 Atypical atrial flutter: Secondary | ICD-10-CM | POA: Diagnosis not present

## 2023-11-25 DIAGNOSIS — I340 Nonrheumatic mitral (valve) insufficiency: Secondary | ICD-10-CM | POA: Diagnosis not present

## 2023-11-25 DIAGNOSIS — I342 Nonrheumatic mitral (valve) stenosis: Secondary | ICD-10-CM

## 2023-11-25 DIAGNOSIS — I351 Nonrheumatic aortic (valve) insufficiency: Secondary | ICD-10-CM

## 2023-11-25 DIAGNOSIS — Z79899 Other long term (current) drug therapy: Secondary | ICD-10-CM | POA: Diagnosis not present

## 2023-11-25 NOTE — Patient Instructions (Signed)
 Medication Instructions:  Your physician recommends that you continue on your current medications as directed. Please refer to the Current Medication list given to you today.  *If you need a refill on your cardiac medications before your next appointment, please call your pharmacy*  Lab Work: None If you have labs (blood work) drawn today and your tests are completely normal, you will receive your results only by: MyChart Message (if you have MyChart) OR A paper copy in the mail If you have any lab test that is abnormal or we need to change your treatment, we will call you to review the results.  Testing/Procedures: None  Follow-Up: At [facility], you and your health needs are our priority.  As part of our continuing mission to provide you with exceptional heart care, our providers are all part of one team.  This team includes your primary Cardiologist (physician) and Advanced Practice Providers or APPs (Physician Assistants and Nurse Practitioners) who all work together to provide you with the care you need, when you need it.  Your next appointment:   76month(s)  Provider:   Norman Herrlich, MD    We recommend signing up for the patient portal called "MyChart".  Sign up information is provided on this After Visit Summary.  MyChart is used to connect with patients for Virtual Visits (Telemedicine).  Patients are able to view lab/test results, encounter notes, upcoming appointments, etc.  Non-urgent messages can be sent to your provider as well.   To learn more about what you can do with MyChart, go to [email or web address].   Other Instructions None

## 2023-12-03 ENCOUNTER — Ambulatory Visit: Payer: Medicare Other | Admitting: Internal Medicine

## 2023-12-31 ENCOUNTER — Other Ambulatory Visit: Payer: Self-pay | Admitting: Internal Medicine

## 2023-12-31 MED ORDER — DAPAGLIFLOZIN PROPANEDIOL 10 MG PO TABS: 10.0000 mg | ORAL_TABLET | Freq: Every day | ORAL | 2 refills | Status: AC

## 2024-01-15 DIAGNOSIS — R202 Paresthesia of skin: Secondary | ICD-10-CM | POA: Diagnosis not present

## 2024-01-15 DIAGNOSIS — L821 Other seborrheic keratosis: Secondary | ICD-10-CM | POA: Diagnosis not present

## 2024-01-15 DIAGNOSIS — L814 Other melanin hyperpigmentation: Secondary | ICD-10-CM | POA: Diagnosis not present

## 2024-01-15 DIAGNOSIS — L570 Actinic keratosis: Secondary | ICD-10-CM | POA: Diagnosis not present

## 2024-01-23 DIAGNOSIS — Z23 Encounter for immunization: Secondary | ICD-10-CM | POA: Diagnosis not present

## 2024-01-26 ENCOUNTER — Other Ambulatory Visit: Payer: Self-pay

## 2024-01-27 ENCOUNTER — Other Ambulatory Visit: Payer: Self-pay

## 2024-01-28 ENCOUNTER — Other Ambulatory Visit: Payer: Self-pay

## 2024-01-29 ENCOUNTER — Ambulatory Visit: Admitting: Internal Medicine

## 2024-02-23 ENCOUNTER — Ambulatory Visit: Admitting: Internal Medicine

## 2024-02-23 ENCOUNTER — Encounter: Payer: Self-pay | Admitting: Internal Medicine

## 2024-02-23 VITALS — BP 134/80 | HR 79 | Temp 97.9°F | Resp 18 | Wt 129.2 lb

## 2024-02-23 DIAGNOSIS — G629 Polyneuropathy, unspecified: Secondary | ICD-10-CM

## 2024-02-23 DIAGNOSIS — Z6823 Body mass index (BMI) 23.0-23.9, adult: Secondary | ICD-10-CM | POA: Insufficient documentation

## 2024-02-23 DIAGNOSIS — I484 Atypical atrial flutter: Secondary | ICD-10-CM

## 2024-02-23 DIAGNOSIS — N1831 Chronic kidney disease, stage 3a: Secondary | ICD-10-CM

## 2024-02-23 HISTORY — DX: Body mass index (BMI) 23.0-23.9, adult: Z68.23

## 2024-02-23 HISTORY — DX: Polyneuropathy, unspecified: G62.9

## 2024-02-23 NOTE — Assessment & Plan Note (Signed)
I will repeat CMP today

## 2024-02-23 NOTE — Progress Notes (Signed)
 Office Visit  Subjective   Patient ID: Marcia Leblanc   DOB: 03/07/1941   Age: 83 y.o.   MRN: [id number]   Chief Complaint Chief Complaint  Patient presents with   Follow-up    3 month follow up     History of Present Illness 83 years old female is here. She has seen heart physician. She is complaining of tingling feeling in her feet and it burns at nighttime.   She do not want to take any medication but wanted to know why she has this pain.    She has atrial flutter and she follows with cardiologist.  She take amiodarone  1 tablets every other day and take Eliquis  for anticoagulation.  She denies any bleeding.     She also has a CKD 3 a/b where her GFR has been stable.   She do not have hypertension and does not take any medication.  Her blood pressure is 134/80 today.  I have discussed with her that I will do urine microalbumin and may need to start Farxiga .  She monitor her blood pressure at home.    She watch her diet and goes to gym.  Her weight is 129 lb with BMI of 23.  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   Respiratory: Negative.    Cardiovascular: Negative.   Gastrointestinal: Negative.   Neurological:  Positive for tingling.       Objective:    Vitals BP 134/80   Pulse 79   Temp 97.9 F (36.6 C)   Resp 18   Wt 129 lb 4 oz (58.6 kg)   SpO2 99%   BMI 23.64 kg/m    Physical Examination Physical Exam Constitutional:       Appearance: Normal appearance.  HENT:     Head: Normocephalic and atraumatic.  Eyes:     Extraocular Movements: Extraocular movements intact.     Pupils: Pupils are equal, round, and reactive to light.  Cardiovascular:     Rate and Rhythm: Normal rate and regular rhythm.     Heart sounds: Normal heart sounds.  Pulmonary:     Effort: Pulmonary effort is normal.     Breath sounds: Normal breath sounds.  Abdominal:     General: Bowel sounds are normal.     Palpations: Abdomen is soft.  Neurological:     General: No focal deficit present.     Mental Status: She is alert and oriented to person, place, and time.        Assessment & Plan:   CKD (chronic kidney disease) I will repeat CMP today  Peripheral polyneuropathy I will do B12 and TSH level. She does not want any doctor.   Atrial flutter (HCC) She is on amiodarone  every other day and take eliquis  for anticoagulation.   BMI 23.0-23.9, adult  She will continue to watch her diet.    Return in about 3  months (around 05/25/2024).   Roetta Dare, MD

## 2024-02-23 NOTE — Assessment & Plan Note (Signed)
 She is on amiodarone  every other day and take eliquis  for anticoagulation.

## 2024-02-23 NOTE — Assessment & Plan Note (Signed)
 She will continue to watch her diet.

## 2024-02-23 NOTE — Assessment & Plan Note (Signed)
 I will do B12 and TSH level. She does not want any doctor.

## 2024-02-24 LAB — CMP14 + ANION GAP
ALT: 15 IU/L (ref 0–32)
AST: 19 IU/L (ref 0–40)
Albumin: 4.2 g/dL (ref 3.7–4.7)
Alkaline Phosphatase: 63 IU/L (ref 48–129)
Anion Gap: 14 mmol/L (ref 10.0–18.0)
BUN/Creatinine Ratio: 10 — ABNORMAL LOW (ref 12–28)
BUN: 11 mg/dL (ref 8–27)
Bilirubin Total: 0.5 mg/dL (ref 0.0–1.2)
CO2: 22 mmol/L (ref 20–29)
Calcium: 9.4 mg/dL (ref 8.7–10.3)
Chloride: 106 mmol/L (ref 96–106)
Creatinine, Ser: 1.15 mg/dL — ABNORMAL HIGH (ref 0.57–1.00)
Globulin, Total: 2.4 g/dL (ref 1.5–4.5)
Glucose: 88 mg/dL (ref 70–99)
Potassium: 4.6 mmol/L (ref 3.5–5.2)
Sodium: 142 mmol/L (ref 134–144)
Total Protein: 6.6 g/dL (ref 6.0–8.5)
eGFR: 48 mL/min/1.73 — ABNORMAL LOW (ref >59)

## 2024-02-24 LAB — MICROALBUMIN / CREATININE URINE RATIO
Creatinine, Urine: 50.6 mg/dL
Microalb/Creat Ratio: 20 mg/g{creat} (ref 0–29)
Microalbumin, Urine: 10 ug/mL

## 2024-02-24 LAB — VITAMIN B12: Vitamin B-12: 926 pg/mL (ref 232–1245)

## 2024-02-24 LAB — TSH: TSH: 1.35 u[IU]/mL (ref 0.450–4.500)

## 2024-03-01 ENCOUNTER — Ambulatory Visit: Payer: Self-pay

## 2024-03-01 NOTE — Progress Notes (Signed)
 Patient called.  Patient aware.       Her labs are good

## 2024-05-25 ENCOUNTER — Ambulatory Visit: Admitting: Cardiology

## 2024-07-07 ENCOUNTER — Ambulatory Visit: Admitting: Cardiology

## 2024-08-23 ENCOUNTER — Ambulatory Visit: Admitting: Internal Medicine
